# Patient Record
Sex: Female | Born: 1955
Health system: Southern US, Community
[De-identification: ages and names within clinical notes are randomized; demographics above are authoritative.]

## PROBLEM LIST (undated history)

## (undated) DIAGNOSIS — R9089 Other abnormal findings on diagnostic imaging of central nervous system: Secondary | ICD-10-CM

## (undated) DIAGNOSIS — Q2112 Patent foramen ovale: Secondary | ICD-10-CM

## (undated) DIAGNOSIS — G43909 Migraine, unspecified, not intractable, without status migrainosus: Secondary | ICD-10-CM

## (undated) DIAGNOSIS — D649 Anemia, unspecified: Secondary | ICD-10-CM

## (undated) DIAGNOSIS — Q211 Atrial septal defect: Secondary | ICD-10-CM

## (undated) HISTORY — DX: Migraine, unspecified, not intractable, without status migrainosus: G43.909

## (undated) HISTORY — DX: Atrial septal defect: Q21.1

## (undated) HISTORY — DX: Patent foramen ovale: Q21.12

## (undated) HISTORY — DX: Other abnormal findings on diagnostic imaging of central nervous system: R90.89

## (undated) HISTORY — PX: CERVICAL FUSION: SHX112

---

## 2000-02-15 ENCOUNTER — Other Ambulatory Visit: Admission: RE | Admit: 2000-02-15 | Discharge: 2000-02-15 | Payer: Self-pay | Admitting: Family Medicine

## 2006-06-07 ENCOUNTER — Other Ambulatory Visit: Admission: RE | Admit: 2006-06-07 | Discharge: 2006-06-07 | Payer: Self-pay | Admitting: Family Medicine

## 2008-12-18 ENCOUNTER — Other Ambulatory Visit: Admission: RE | Admit: 2008-12-18 | Discharge: 2008-12-18 | Payer: Self-pay | Admitting: Family Medicine

## 2011-10-06 ENCOUNTER — Other Ambulatory Visit: Payer: Self-pay | Admitting: Obstetrics & Gynecology

## 2011-10-06 ENCOUNTER — Ambulatory Visit (HOSPITAL_COMMUNITY)
Admission: RE | Admit: 2011-10-06 | Discharge: 2011-10-06 | Disposition: A | Discharge status: Home / Self Care | Payer: BC Managed Care – PPO | Source: Ambulatory Visit | Attending: Obstetrics & Gynecology | Admitting: Obstetrics & Gynecology

## 2011-10-06 DIAGNOSIS — N920 Excessive and frequent menstruation with regular cycle: Secondary | ICD-10-CM | POA: Insufficient documentation

## 2011-10-06 DIAGNOSIS — N83209 Unspecified ovarian cyst, unspecified side: Secondary | ICD-10-CM | POA: Insufficient documentation

## 2011-10-06 DIAGNOSIS — D251 Intramural leiomyoma of uterus: Secondary | ICD-10-CM | POA: Insufficient documentation

## 2011-10-11 ENCOUNTER — Encounter (HOSPITAL_COMMUNITY): Payer: Self-pay | Admitting: *Deleted

## 2011-10-13 ENCOUNTER — Encounter (HOSPITAL_COMMUNITY): Payer: Self-pay | Admitting: Pharmacist

## 2011-10-18 ENCOUNTER — Encounter (HOSPITAL_COMMUNITY): Payer: Self-pay | Admitting: Anesthesiology

## 2011-10-18 ENCOUNTER — Encounter (HOSPITAL_COMMUNITY): Payer: Self-pay | Admitting: *Deleted

## 2011-10-18 ENCOUNTER — Ambulatory Visit (HOSPITAL_COMMUNITY)
Admission: RE | Admit: 2011-10-18 | Discharge: 2011-10-18 | Disposition: A | Discharge status: Home / Self Care | Payer: BC Managed Care – PPO | Source: Ambulatory Visit | Attending: Obstetrics & Gynecology | Admitting: Obstetrics & Gynecology

## 2011-10-18 ENCOUNTER — Ambulatory Visit (HOSPITAL_COMMUNITY): Payer: BC Managed Care – PPO | Admitting: Anesthesiology

## 2011-10-18 ENCOUNTER — Encounter (HOSPITAL_COMMUNITY)
Admission: RE | Disposition: A | Discharge status: Home / Self Care | Payer: Self-pay | Source: Ambulatory Visit | Attending: Obstetrics & Gynecology

## 2011-10-18 DIAGNOSIS — D500 Iron deficiency anemia secondary to blood loss (chronic): Secondary | ICD-10-CM | POA: Insufficient documentation

## 2011-10-18 DIAGNOSIS — D649 Anemia, unspecified: Secondary | ICD-10-CM | POA: Diagnosis present

## 2011-10-18 DIAGNOSIS — N950 Postmenopausal bleeding: Secondary | ICD-10-CM | POA: Diagnosis present

## 2011-10-18 HISTORY — DX: Anemia, unspecified: D64.9

## 2011-10-18 HISTORY — PX: HYSTEROSCOPY WITH D & C: SHX1775

## 2011-10-18 LAB — CBC
HCT: 36.3 % (ref 36.0–46.0)
Hemoglobin: 11.8 g/dL — ABNORMAL LOW (ref 12.0–15.0)
MCH: 32.2 pg (ref 26.0–34.0)
MCHC: 32.5 g/dL (ref 30.0–36.0)
MCV: 98.9 fL (ref 78.0–100.0)
Platelets: 253 10*3/uL (ref 150–400)
RBC: 3.67 MIL/uL — ABNORMAL LOW (ref 3.87–5.11)
RDW: 13.9 % (ref 11.5–15.5)
WBC: 4.3 10*3/uL (ref 4.0–10.5)

## 2011-10-18 SURGERY — DILATATION AND CURETTAGE /HYSTEROSCOPY
Anesthesia: General | Site: Vagina | Wound class: Clean Contaminated

## 2011-10-18 MED ORDER — MIDAZOLAM HCL 5 MG/5ML IJ SOLN
INTRAMUSCULAR | Status: DC | PRN
  Administered 2011-10-18: 2 mg via INTRAVENOUS

## 2011-10-18 MED ORDER — LACTATED RINGERS IV SOLN
INTRAVENOUS | Status: DC
  Administered 2011-10-18 (×2): via INTRAVENOUS

## 2011-10-18 MED ORDER — CEFAZOLIN SODIUM 1-5 GM-% IV SOLN
1.0000 g | INTRAVENOUS | Status: AC
  Administered 2011-10-18: 1 g via INTRAVENOUS

## 2011-10-18 MED ORDER — GLYCOPYRROLATE 0.2 MG/ML IJ SOLN
INTRAMUSCULAR | Status: AC
  Filled 2011-10-18: qty 1

## 2011-10-18 MED ORDER — ONDANSETRON HCL 4 MG/2ML IJ SOLN
INTRAMUSCULAR | Status: AC
  Filled 2011-10-18: qty 2

## 2011-10-18 MED ORDER — PROPOFOL 10 MG/ML IV EMUL
INTRAVENOUS | Status: DC | PRN
  Administered 2011-10-18: 180 mg via INTRAVENOUS

## 2011-10-18 MED ORDER — PROPOFOL 10 MG/ML IV EMUL
INTRAVENOUS | Status: AC
  Filled 2011-10-18: qty 20

## 2011-10-18 MED ORDER — ONDANSETRON HCL 4 MG/2ML IJ SOLN
INTRAMUSCULAR | Status: DC | PRN
  Administered 2011-10-18: 4 mg via INTRAVENOUS

## 2011-10-18 MED ORDER — KETOROLAC TROMETHAMINE 30 MG/ML IJ SOLN
INTRAMUSCULAR | Status: DC | PRN
  Administered 2011-10-18: 30 mg via INTRAVENOUS

## 2011-10-18 MED ORDER — MIDAZOLAM HCL 2 MG/2ML IJ SOLN
INTRAMUSCULAR | Status: AC
  Filled 2011-10-18: qty 2

## 2011-10-18 MED ORDER — GLYCOPYRROLATE 0.2 MG/ML IJ SOLN
INTRAMUSCULAR | Status: DC | PRN
  Administered 2011-10-18: 0.2 mg via INTRAVENOUS

## 2011-10-18 MED ORDER — LIDOCAINE HCL (CARDIAC) 20 MG/ML IV SOLN
INTRAVENOUS | Status: DC | PRN
  Administered 2011-10-18: 60 mg via INTRAVENOUS

## 2011-10-18 MED ORDER — MUPIROCIN 2 % EX OINT
TOPICAL_OINTMENT | CUTANEOUS | Status: AC
  Filled 2011-10-18: qty 22

## 2011-10-18 MED ORDER — LIDOCAINE-EPINEPHRINE 1 %-1:100000 IJ SOLN
INTRAMUSCULAR | Status: DC | PRN
  Administered 2011-10-18: 10 mL

## 2011-10-18 MED ORDER — FENTANYL CITRATE 0.05 MG/ML IJ SOLN
INTRAMUSCULAR | Status: AC
  Filled 2011-10-18: qty 2

## 2011-10-18 MED ORDER — LIDOCAINE HCL (CARDIAC) 20 MG/ML IV SOLN
INTRAVENOUS | Status: AC
  Filled 2011-10-18: qty 5

## 2011-10-18 MED ORDER — CEFAZOLIN SODIUM 1-5 GM-% IV SOLN
INTRAVENOUS | Status: AC
  Filled 2011-10-18: qty 50

## 2011-10-18 MED ORDER — KETOROLAC TROMETHAMINE 30 MG/ML IJ SOLN
INTRAMUSCULAR | Status: AC
  Filled 2011-10-18: qty 1

## 2011-10-18 MED ORDER — FENTANYL CITRATE 0.05 MG/ML IJ SOLN
INTRAMUSCULAR | Status: DC | PRN
  Administered 2011-10-18: 100 ug via INTRAVENOUS

## 2011-10-18 MED ORDER — DEXAMETHASONE SODIUM PHOSPHATE 10 MG/ML IJ SOLN
INTRAMUSCULAR | Status: DC | PRN
  Administered 2011-10-18: 10 mg via INTRAVENOUS

## 2011-10-18 MED ORDER — DEXAMETHASONE SODIUM PHOSPHATE 10 MG/ML IJ SOLN
INTRAMUSCULAR | Status: AC
  Filled 2011-10-18: qty 1

## 2011-10-18 MED ORDER — FENTANYL CITRATE 0.05 MG/ML IJ SOLN: 25.0000 ug | INTRAMUSCULAR | Status: DC | PRN

## 2011-10-18 MED ORDER — GLYCINE 1.5 % IR SOLN
Status: DC | PRN
  Administered 2011-10-18: 3000 mL

## 2011-10-18 MED ORDER — HYDROCODONE-ACETAMINOPHEN 5-500 MG PO TABS: 2.0000 | ORAL_TABLET | Freq: Four times a day (QID) | ORAL | Status: DC | PRN

## 2011-10-18 SURGICAL SUPPLY — 16 items
CANISTER SUCTION 2500CC (MISCELLANEOUS) ×2 IMPLANT
CATH ROBINSON RED A/P 16FR (CATHETERS) ×2 IMPLANT
CLOTH BEACON ORANGE TIMEOUT ST (SAFETY) ×2 IMPLANT
CONTAINER PREFILL 10% NBF 60ML (FORM) ×2 IMPLANT
DILATOR CANAL MILEX (MISCELLANEOUS) IMPLANT
ELECT REM PT RETURN 9FT ADLT (ELECTROSURGICAL)
ELECTRODE REM PT RTRN 9FT ADLT (ELECTROSURGICAL) IMPLANT
GLOVE BIOGEL PI IND STRL 7.0 (GLOVE) ×1 IMPLANT
GLOVE BIOGEL PI INDICATOR 7.0 (GLOVE) ×1
GLOVE ECLIPSE 6.5 STRL STRAW (GLOVE) ×4 IMPLANT
GOWN PREVENTION PLUS LG XLONG (DISPOSABLE) ×2 IMPLANT
GOWN STRL REIN XL XLG (GOWN DISPOSABLE) ×2 IMPLANT
LOOP ANGLED CUTTING 22FR (CUTTING LOOP) IMPLANT
PACK HYSTEROSCOPY LF (CUSTOM PROCEDURE TRAY) ×2 IMPLANT
TOWEL OR 17X24 6PK STRL BLUE (TOWEL DISPOSABLE) ×4 IMPLANT
WATER STERILE IRR 1000ML POUR (IV SOLUTION) ×2 IMPLANT

## 2011-10-18 NOTE — Addendum Note (Signed)
Addendum  created 10/18/11 1437 by Algis Greenhouse, CRNA   Modules edited:Charges VN

## 2011-10-18 NOTE — Discharge Instructions (Signed)
Post-surgical Instructions, Outpatient Surgery  You may expect to feel dizzy, weak, and drowsy for as long as 24 hours after receiving the medicine that made you sleep (anesthetic). For the first 24 hours after your surgery:    Do not drive a car, ride a bicycle, participate in physical activities, or take public transportation until you are done taking narcotic pain medicines or as directed by Dr. Hyacinth Meeker.   Do not drink alcohol or take tranquilizers.   Do not take medicine that has not been prescribed by your physicians.   Do not sign important papers or make important decisions while on narcotic pain medicines.   Have a responsible person with you.   PAIN MANAGEMENT  Motrin 800mg .  (This is the same as 4-200mg  over the counter tablets of Motrin or ibuprofen.)  You may take this every eight hours or as needed for cramping.    Vicodin 5/500mg .  For more severe pain, take one or two tablets every four to six hours as needed for pain control.  (Remember that narcotic pain medications increase your risk of constipation.  If this becomes a problem, you may take an over the counter stool softener like Colace 100mg  up to four times a day.)  DO'S AND DON'T'S  Do not take a tub bath for one week.  You may shower on the first day after your surgery  Do not do any heavy lifting for one to two weeks.  This increases the chance of bleeding.  Do move around as you feel able.  Stairs are fine.  You may begin to exercise again as you feel able.  Do not lift any weights for two weeks.  Do not put anything in the vagina for two weeks--no tampons, intercourse, or douching.    REGULAR MEDIATIONS/VITAMINS:  You may restart all of your regular medications as prescribed.  You may restart all of your vitamins as you normally take them.   Stop your Aygestin.  You will start a cycle over the next few days.  This may be heavy.  Do not worry.  Call the office if you have concerns.  [phone].  If it is after  hours, the message will tell you how to reach the on-call physician.   PLEASE CALL OR SEEK MEDICAL CARE IF:  You have persistent nausea and vomiting.   You have trouble eating or drinking.   You have an oral temperature above 100.5.   You have constipation that is not helped by adjusting diet or increasing fluid intake. Pain medicines are a common cause of constipation.   You have heavy vaginal bleeding  You have redness or drainage from your incision(s) or there is increasing pain or tenderness near or in the surgical site.

## 2011-10-18 NOTE — Transfer of Care (Signed)
Immediate Anesthesia Transfer of Care Note  Patient: Angela Snyder  Procedure(s) Performed: Procedure(s) (LRB): DILATATION AND CURETTAGE /HYSTEROSCOPY (N/A)  Patient Location: PACU  Anesthesia Type: General  Level of Consciousness: awake and sedated  Airway & Oxygen Therapy: Patient Spontanous Breathing and Patient connected to nasal cannula oxygen  Post-op Assessment: Report given to PACU RN  Post vital signs: Reviewed and stable  Complications: No apparent anesthesia complications

## 2011-10-18 NOTE — Anesthesia Preprocedure Evaluation (Addendum)

## 2011-10-18 NOTE — Anesthesia Postprocedure Evaluation (Signed)
  Anesthesia Post-op Note  Patient: Angela Snyder  Procedure(s) Performed: Procedure(s) (LRB): DILATATION AND CURETTAGE /HYSTEROSCOPY (N/A)  Patient is awake and responsive. Pain and nausea are reasonably well controlled. Vital signs are stable and clinically acceptable. Oxygen saturation is clinically acceptable. There are no apparent anesthetic complications at this time. Patient is ready for discharge.

## 2011-10-18 NOTE — Op Note (Addendum)
10/18/2011  11:00 AM  PATIENT:  Angela Snyder  56 y.o. female G4P3 MWF with postmenopausal bleeding for about 40 days.  Anemia resulting from this bleeding.  Endometrial biopsy showing proliferative endometrium.  PRE-OPERATIVE DIAGNOSIS:  PMB  POST-OPERATIVE DIAGNOSIS:  PMB  PROCEDURE:  Procedure(s): DILATATION AND CURETTAGE /HYSTEROSCOPY  SURGEON:  Jahron Hunsinger,M SUZANNE  ASSISTANTS: OR staff   ANESTHESIA:   LMA, Dr. Cristela Blue oversaw the case  ESTIMATED BLOOD LOSS: * No blood loss amount entered *  BLOOD ADMINISTERED:none   FLUIDS: 1200ccLR  UOP: 200 cc drained with I&O cath at beginning of procedure  SPECIMEN:  Large amount of endometrial curettings  DISPOSITION OF SPECIMEN:  PATHOLOGY  FINDINGS: thick, fluffy endometrium.  No evidence of polyps  DESCRIPTION OF OPERATION: Patient was taken to the operating room. She is placed in the supine position. Anesthesia was administered by the anesthesia staff without difficulty. The legs were lifted to the low lithotomy position and placed in [place] stirrups. SCDs were on her lower extremities and functioning properly. Legs were lifted to the high lithotomy position and the perineum inner thighs and vagina were prepped and draped in a normal standard fashion with Betadine prep. A timeout was performed. A red rubber Foley catheter was used to drain the bladder of all urine. A bivalve speculum was placed in the vagina. The anterior lip of the cervix was grasped with a single-tooth tenaculum. A paracervical block of 1% Xylocaine mixed one-to-one with epinephrine (1:100,000 units) was used. 10 cc total was placed. The uterus then sounded to 9-1/2 cm. The cervix is dilated with Pratt dilators up to #21. A 3.9 mm diagnostic hysteroscope was obtained. This was passed to the cervical canal into the endometrial cavity. There was fluffy thick vascular-looking tissue present. Using a #1 toothed curette, the endometrial cavity was curetted until rough  gritty texture is noted in all quadrants.  A large amount of specimen was obtained.  The hysteroscope was used to revisualize endometrial cavity. There was a significant improvement amount of tissue that was present. At this point the procedure was ended. The hysteroscope was removed. The tenaculum was from the anterior lip of the cervix and there was no bleeding noted. The bivalve speculum was removed from the vagina. Sponge, laps, instruments, and needle counts were correct x2. Patient was given 30 mg IV of Toradol.  1.5% Glycine was used as the hysteroscopic fluid.  Deficit was 115cc but this includes fluid that was on the floor.  Betadine prep was washed of her skin and her legs were taken out of the [place] stirrups and placed back in the supine position. She was awakened from anesthesia and taken to the room in stable condition.  COUNTS:  YES  PLAN OF CARE: Transfer to PACU

## 2011-10-18 NOTE — H&P (Signed)
Angela Snyder is an 56 y.o. female G4P3 MWf here with postmenopausal bleeding.  Hemoglobin was 10.6 due to her bleeding for about 40 days.  She was started on Aygestin 10mg  BID and and endometrial biopsy was performed.  This was negative for abnormal cells.  TVUS showed endometrial thickness of 2cm.  Hysteroscopy and D&C recommended to thin endometrium and treat bleeding.  Pertinent Gynecological History: Menses: post-menopausal Bleeding: post menopausal bleeding Contraception: none DES exposure: denies Blood transfusions: none Sexually transmitted diseases: no past history Previous GYN Procedures: none  Last mammogram: normal Date: 2013 Last pap: normal Date: 2013 OB History: G4, P3   Menstrual History: Menarche age: 81 No LMP recorded. Patient is postmenopausal.    Past Medical History  Diagnosis Date  . Anemia     Past Surgical History  Procedure Date  . Cesarean section     x 3  . Cervical fusion     C5/6    History reviewed. No pertinent family history.  Social History:  does not have a smoking history on file. She has never used smokeless tobacco. She reports that she drinks about 4.2 ounces of alcohol per week. She reports that she does not use illicit drugs.  Allergies:  Allergies  Allergen Reactions  . Codeine Hives    Prescriptions prior to admission  Medication Sig Dispense Refill  . calcium carbonate (OS-CAL) 600 MG TABS Take 600 mg by mouth daily.      Marland Kitchen estradiol (VIVELLE-DOT) 0.1 MG/24HR Place 1 patch onto the skin 2 (two) times a week.      . ferrous sulfate 325 (65 FE) MG tablet Take 325 mg by mouth daily with breakfast.      . norethindrone (AYGESTIN) 5 MG tablet Take 10 mg by mouth 2 (two) times daily.      . Omega-3 Fatty Acids (FISH OIL) 1200 MG CAPS Take 2 capsules by mouth daily.        Review of Systems  Constitutional: Negative for fever and chills.  Respiratory: Negative for cough.   Cardiovascular: Negative for chest pain and  palpitations.  Gastrointestinal: Negative for heartburn.  Genitourinary: Negative for dysuria.  Musculoskeletal: Negative for myalgias.  Skin: Negative for rash.  Neurological: Negative for headaches.  Psychiatric/Behavioral: Negative for depression.    Blood pressure 106/57, pulse 62, temperature 98.1 F (36.7 C), temperature source Oral, resp. rate 18, height 5\' 4"  (1.626 m), weight 49.896 kg (110 lb), SpO2 100.00%. Physical Exam  Constitutional: She is oriented to person, place, and time. She appears well-developed and well-nourished.  HENT:  Head: Normocephalic and atraumatic.  Neck: Normal range of motion. Neck supple.  Cardiovascular: Normal rate and regular rhythm.   Respiratory: Effort normal and breath sounds normal.  GI: Soft. Bowel sounds are normal.  Neurological: She is alert and oriented to person, place, and time.  Skin: Skin is warm and dry.  Psychiatric: She has a normal mood and affect.    Results for orders placed during the hospital encounter of 10/18/11 (from the past 24 hour(s))  CBC     Status: Abnormal   Collection Time   10/18/11  9:03 AM      Component Value Range   WBC 4.3  4.0 - 10.5 (K/uL)   RBC 3.67 (*) 3.87 - 5.11 (MIL/uL)   Hemoglobin 11.8 (*) 12.0 - 15.0 (g/dL)   HCT 11.9  14.7 - 82.9 (%)   MCV 98.9  78.0 - 100.0 (fL)   MCH 32.2  26.0 -  34.0 (pg)   MCHC 32.5  30.0 - 36.0 (g/dL)   RDW 16.1  09.6 - 04.5 (%)   Platelets 253  150 - 400 (K/uL)    No results found.  Assessment/Plan: 63 year G4P3 MWF with postmenopausal bleeding here for D&C and hysteroscopy.  Risks and benefits discussed.  Patient here and ready to proceed.  Ramisa Duman,M SUZANNE 10/18/2011, 10:03 AM

## 2011-10-19 ENCOUNTER — Encounter (HOSPITAL_COMMUNITY): Payer: Self-pay | Admitting: Obstetrics & Gynecology

## 2012-02-28 ENCOUNTER — Encounter: Payer: Self-pay | Admitting: Internal Medicine

## 2012-04-03 ENCOUNTER — Ambulatory Visit (AMBULATORY_SURGERY_CENTER): Payer: BC Managed Care – PPO | Admitting: *Deleted

## 2012-04-03 VITALS — Ht 64.0 in | Wt 110.0 lb

## 2012-04-03 DIAGNOSIS — Z1211 Encounter for screening for malignant neoplasm of colon: Secondary | ICD-10-CM

## 2012-04-03 MED ORDER — SUPREP BOWEL PREP KIT 17.5-3.13-1.6 GM/177ML PO SOLN: ORAL | Status: DC

## 2012-04-04 ENCOUNTER — Telehealth: Payer: Self-pay | Admitting: *Deleted

## 2012-04-04 NOTE — Telephone Encounter (Signed)
Left message on phone to stop Iron 5-7 days before procedure.

## 2012-04-06 ENCOUNTER — Other Ambulatory Visit: Payer: Self-pay | Admitting: Otolaryngology

## 2012-04-06 DIAGNOSIS — I69993 Ataxia following unspecified cerebrovascular disease: Secondary | ICD-10-CM

## 2012-04-11 ENCOUNTER — Ambulatory Visit
Admission: RE | Admit: 2012-04-11 | Discharge: 2012-04-11 | Disposition: A | Discharge status: Home / Self Care | Payer: BC Managed Care – PPO | Source: Ambulatory Visit | Attending: Otolaryngology | Admitting: Otolaryngology

## 2012-04-11 DIAGNOSIS — I69993 Ataxia following unspecified cerebrovascular disease: Secondary | ICD-10-CM

## 2012-04-11 MED ORDER — GADOBENATE DIMEGLUMINE 529 MG/ML IV SOLN
10.0000 mL | Freq: Once | INTRAVENOUS | Status: AC | PRN
  Administered 2012-04-11: 10 mL via INTRAVENOUS

## 2012-04-17 ENCOUNTER — Encounter: Payer: Self-pay | Admitting: Internal Medicine

## 2012-04-17 ENCOUNTER — Ambulatory Visit (AMBULATORY_SURGERY_CENTER): Payer: BC Managed Care – PPO | Admitting: Internal Medicine

## 2012-04-17 VITALS — BP 154/79 | HR 67 | Temp 96.8°F | Resp 18 | Ht 64.0 in | Wt 110.0 lb

## 2012-04-17 DIAGNOSIS — Z1211 Encounter for screening for malignant neoplasm of colon: Secondary | ICD-10-CM

## 2012-04-17 DIAGNOSIS — D126 Benign neoplasm of colon, unspecified: Secondary | ICD-10-CM

## 2012-04-17 MED ORDER — SODIUM CHLORIDE 0.9 % IV SOLN: 500.0000 mL | INTRAVENOUS | Status: DC

## 2012-04-17 NOTE — Op Note (Signed)
[facility] [address] [place], [address]   COLONOSCOPY PROCEDURE REPORT  PATIENT: Angela, Snyder  MR#: [id number] BIRTHDATE: June 18, 1956 , 55  yrs. old GENDER: Female ENDOSCOPIST: Beverley Fiedler, MD REFERRED AO:[id number], Consuella Lose PROCEDURE DATE:  04/17/2012 PROCEDURE:   Colonoscopy with snare polypectomy and Colonoscopy with cold biopsy polypectomy ASA CLASS:   Class I INDICATIONS:average risk screening and first colonoscopy. MEDICATIONS: MAC sedation, administered by CRNA and Propofol (Diprivan) 220 mg IV  DESCRIPTION OF PROCEDURE:   After the risks benefits and alternatives of the procedure were thoroughly explained, informed consent was obtained.  A digital rectal exam revealed no rectal mass.   The LB [id number]-[id number]  endoscope was introduced through the anus and advanced to the cecum, which was identified by both the appendix and ileocecal valve. No adverse events experienced. The quality of the prep was Suprep good  The instrument was then slowly withdrawn as the colon was fully examined.   COLON FINDINGS: A sessile polyp measuring 5 mm in size was found in the ascending colon.  A polypectomy was performed with a cold snare.  The resection was complete and the polyp tissue was completely retrieved.   A sessile polyp measuring 3 mm in size was found in the rectum.  A polypectomy was performed with cold forceps.  The resection was complete and the polyp tissue was completely retrieved.   The colon mucosa was otherwise normal. Retroflexed views revealed no abnormalities. The time to cecum=7 minutes 28 seconds.  Withdrawal time=13 minutes 18 seconds.  The scope was withdrawn and the procedure completed. COMPLICATIONS: There were no complications.  ENDOSCOPIC IMPRESSION: 1.   Sessile polyp measuring 5 mm in size was found in the ascending colon; polypectomy was performed with a cold snare 2.   Sessile polyp measuring 3 mm in size was found in the  rectum; polypectomy was performed with cold forceps 3.   The colon mucosa was otherwise normal  RECOMMENDATIONS: 1.  If the polyps removed today are proven to be adenomatous (pre-cancerous) polyps, you will need a repeat colonoscopy in 5 years.  Otherwise you should continue to follow colorectal cancer screening guidelines for "routine risk" patients with colonoscopy in 10 years.  You will receive a letter within 1-2 weeks with the results of your biopsy as well as final recommendations.  Please call my office if you have not received a letter after 3 weeks. 2.  await pathology results   eSigned:  Beverley Fiedler, MD 04/17/2012 12:18 PM   cc: Maurice Small, MD and The Patient   PATIENT NAME:  Angela, Snyder MR#: [id number]

## 2012-04-17 NOTE — Progress Notes (Signed)
Patient did not experience any of the following events: a burn prior to discharge; a fall within the facility; wrong site/side/patient/procedure/implant event; or a hospital transfer or hospital admission upon discharge from the facility. (G8907) Patient did not have preoperative order for IV antibiotic SSI prophylaxis. (G8918)  

## 2012-04-17 NOTE — Patient Instructions (Addendum)
Findings: Polyps  YOU HAD AN ENDOSCOPIC PROCEDURE TODAY AT THE [facility]: Refer to the procedure report that was given to you for any specific questions about what was found during the examination.  If the procedure report does not answer your questions, please call your gastroenterologist to clarify.  If you requested that your care partner not be given the details of your procedure findings, then the procedure report has been included in a sealed envelope for you to review at your convenience later.  YOU SHOULD EXPECT: Some feelings of bloating in the abdomen. Passage of more gas than usual.  Walking can help get rid of the air that was put into your GI tract during the procedure and reduce the bloating. If you had a lower endoscopy (such as a colonoscopy or flexible sigmoidoscopy) you may notice spotting of blood in your stool or on the toilet paper. If you underwent a bowel prep for your procedure, then you may not have a normal bowel movement for a few days.  DIET: Your first meal following the procedure should be a light meal and then it is ok to progress to your normal diet.  A half-sandwich or bowl of soup is an example of a good first meal.  Heavy or fried foods are harder to digest and may make you feel nauseous or bloated.  Likewise meals heavy in dairy and vegetables can cause extra gas to form and this can also increase the bloating.  Drink plenty of fluids but you should avoid alcoholic beverages for 24 hours.  ACTIVITY: Your care partner should take you home directly after the procedure.  You should plan to take it easy, moving slowly for the rest of the day.  You can resume normal activity the day after the procedure however you should NOT DRIVE or use heavy machinery for 24 hours (because of the sedation medicines used during the test).    SYMPTOMS TO REPORT IMMEDIATELY: A gastroenterologist can be reached at any hour.  During normal business hours, 8:30 AM to 5:00 PM  Monday through Friday, call [phone].  After hours and on weekends, please call the GI answering service at [phone] who will take a message and have the physician on call contact you.   Following lower endoscopy (colonoscopy or flexible sigmoidoscopy):  Excessive amounts of blood in the stool  Significant tenderness or worsening of abdominal pains  Swelling of the abdomen that is new, acute  Fever of 100F or higher  Following upper endoscopy (EGD)  Vomiting of blood or coffee ground material  New chest pain or pain under the shoulder blades  Painful or persistently difficult swallowing  New shortness of breath  Fever of 100F or higher  Black, tarry-looking stools  FOLLOW UP: If any biopsies were taken you will be contacted by phone or by letter within the next 1-3 weeks.  Call your gastroenterologist if you have not heard about the biopsies in 3 weeks.  Our staff will call the home number listed on your records the next business day following your procedure to check on you and address any questions or concerns that you may have at that time regarding the information given to you following your procedure. This is a courtesy call and so if there is no answer at the home number and we have not heard from you through the emergency physician on call, we will assume that you have returned to your regular daily activities without incident.  SIGNATURES/CONFIDENTIALITY: You  and/or your care partner have signed paperwork which will be entered into your electronic medical record.  These signatures attest to the fact that that the information above on your After Visit Summary has been reviewed and is understood.  Full responsibility of the confidentiality of this discharge information lies with you and/or your care-partner.   Please follow all discharge instructions given to you by the recovery room nurse. If you have any questions or problems after discharge please call one of the numbers  listed above. You will receive a phone call in the am to see how you are doing and answer any questions you may have. Thank you for choosing [facility] for your health care needs.

## 2012-04-17 NOTE — Progress Notes (Signed)
Pressure applied to abdomen to reach cecum.  

## 2012-04-18 ENCOUNTER — Telehealth: Payer: Self-pay

## 2012-04-18 NOTE — Telephone Encounter (Signed)
Left a message on the pt's answering machine [phone] for the pt to call if any questions or concerns. Maw

## 2012-04-23 DIAGNOSIS — K621 Rectal polyp: Secondary | ICD-10-CM

## 2012-04-23 DIAGNOSIS — K620 Anal polyp: Secondary | ICD-10-CM

## 2012-04-23 DIAGNOSIS — D126 Benign neoplasm of colon, unspecified: Secondary | ICD-10-CM

## 2012-04-24 ENCOUNTER — Encounter: Payer: Self-pay | Admitting: Internal Medicine

## 2012-08-28 DIAGNOSIS — G44009 Cluster headache syndrome, unspecified, not intractable: Secondary | ICD-10-CM | POA: Insufficient documentation

## 2012-08-28 DIAGNOSIS — F4322 Adjustment disorder with anxiety: Secondary | ICD-10-CM | POA: Insufficient documentation

## 2012-08-28 DIAGNOSIS — M5030 Other cervical disc degeneration, unspecified cervical region: Secondary | ICD-10-CM | POA: Insufficient documentation

## 2012-08-28 DIAGNOSIS — N951 Menopausal and female climacteric states: Secondary | ICD-10-CM | POA: Insufficient documentation

## 2012-08-28 DIAGNOSIS — R42 Dizziness and giddiness: Secondary | ICD-10-CM | POA: Insufficient documentation

## 2012-09-05 DIAGNOSIS — Q211 Atrial septal defect: Secondary | ICD-10-CM | POA: Insufficient documentation

## 2012-09-05 DIAGNOSIS — Q2112 Patent foramen ovale: Secondary | ICD-10-CM | POA: Insufficient documentation

## 2013-02-22 ENCOUNTER — Telehealth: Payer: Self-pay | Admitting: Neurology

## 2013-02-22 NOTE — Telephone Encounter (Signed)
Schedule with lynn or me when opening

## 2013-02-22 NOTE — Telephone Encounter (Signed)
Patient having dizzy spells and wants to be seen ASAP. Can patient be scheduled with you or Larita Fife? Please advise.

## 2013-06-11 IMAGING — US US TRANSVAGINAL NON-OB
1 series · 13 of 25 positions shown · non-contrast
Comparison: None.

CLINICAL DATA: Excessive or frequent menstruation. Perimenoausal.
LMP 09/08/2011.

TRANSABDOMINAL AND TRANSVAGINAL ULTRASOUND OF PELVIS
TECHNIQUE: Both transabdominal and transvaginal ultrasound
examinations of the pelvis were performed. Transabdominal technique
was performed for global imaging of the pelvis including uterus,
ovaries, adnexal regions, and pelvic cul-de-sac.

[Series 1: us pelvis complete · 13 of 61 slices shown]
[im 1/61]
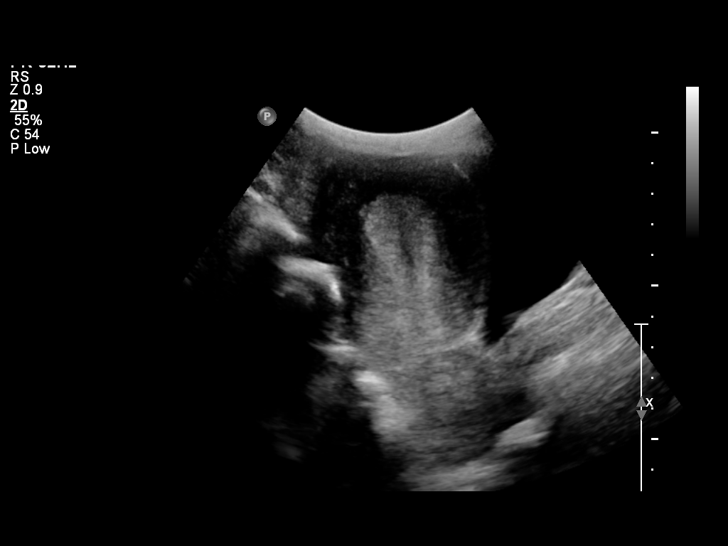
[im 6/61]
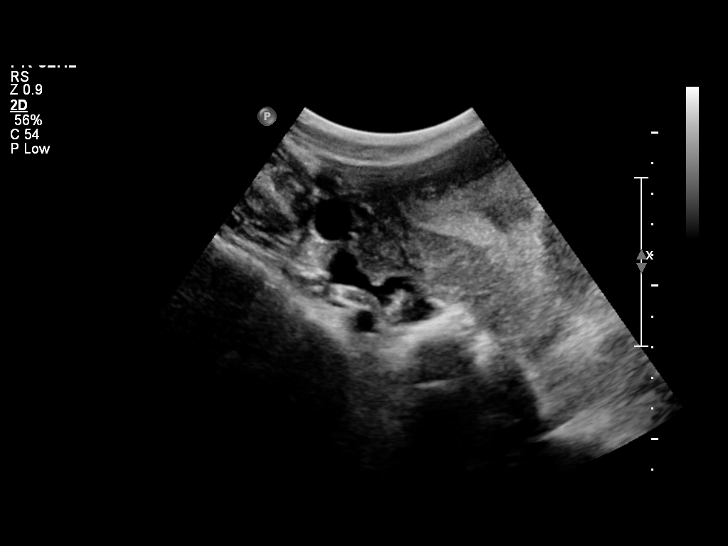
[im 11/61]
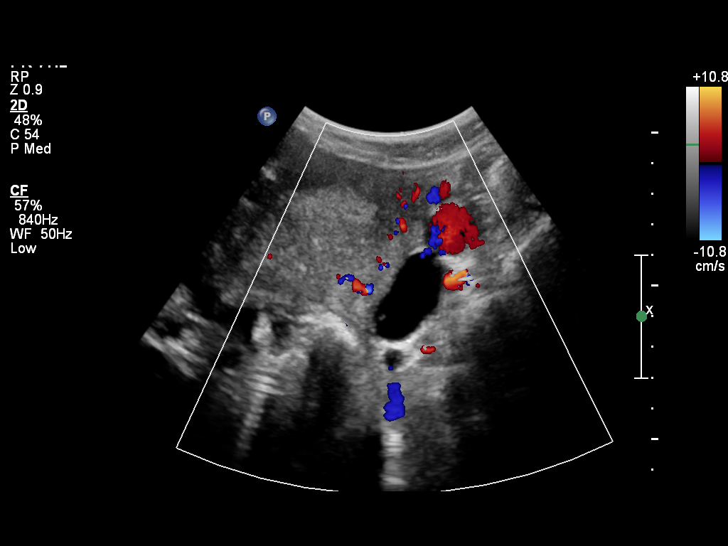
[im 16/61]
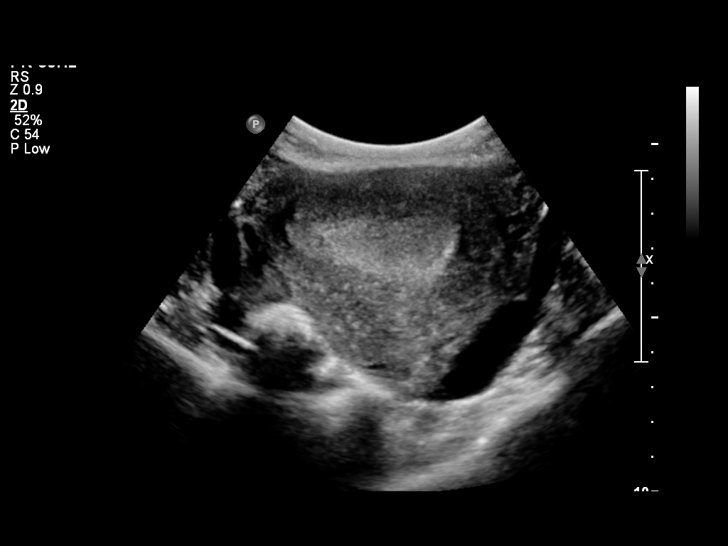
[im 21/61]
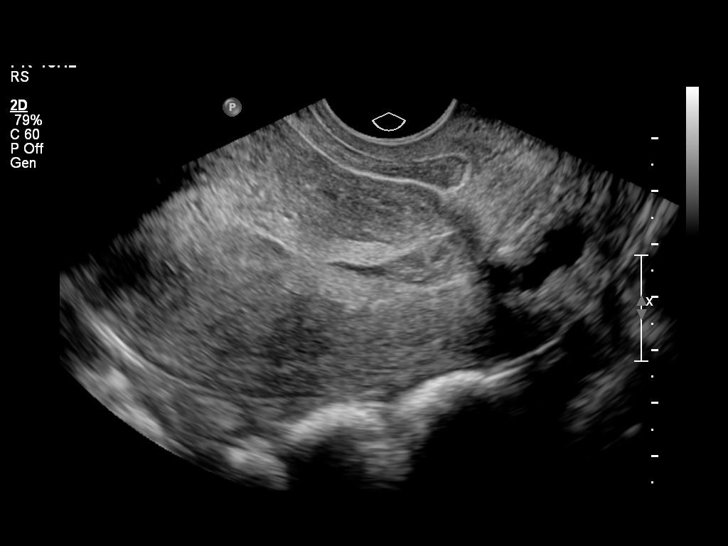
[im 26/61]
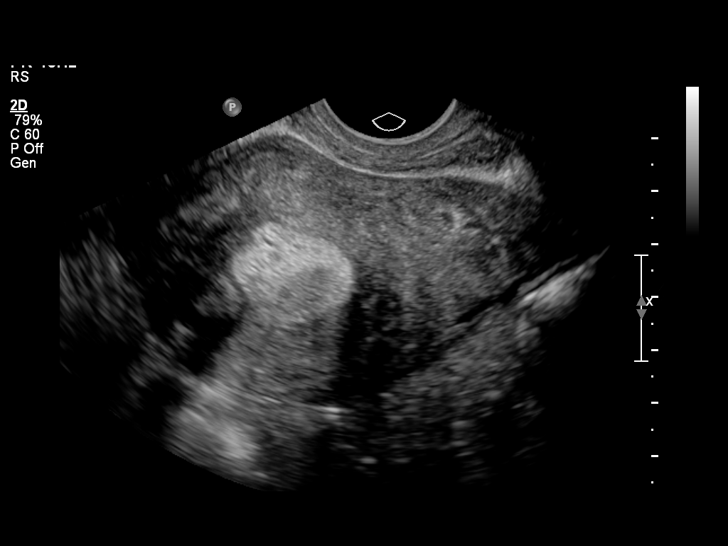
[im 31/61]
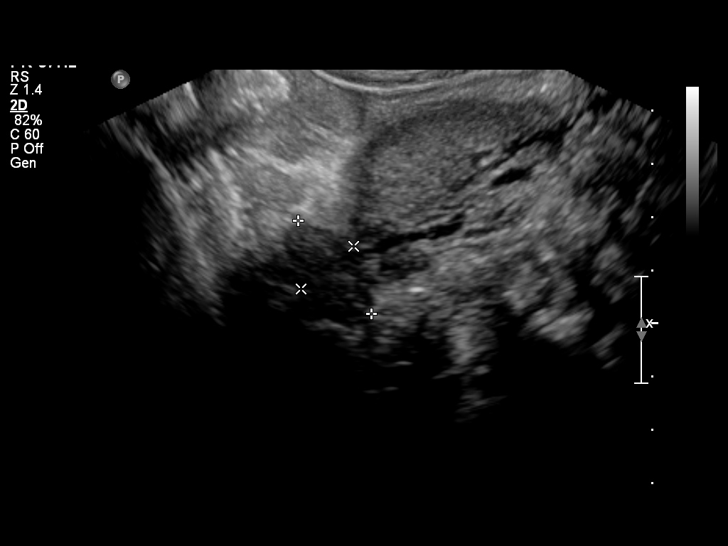
[im 36/61]
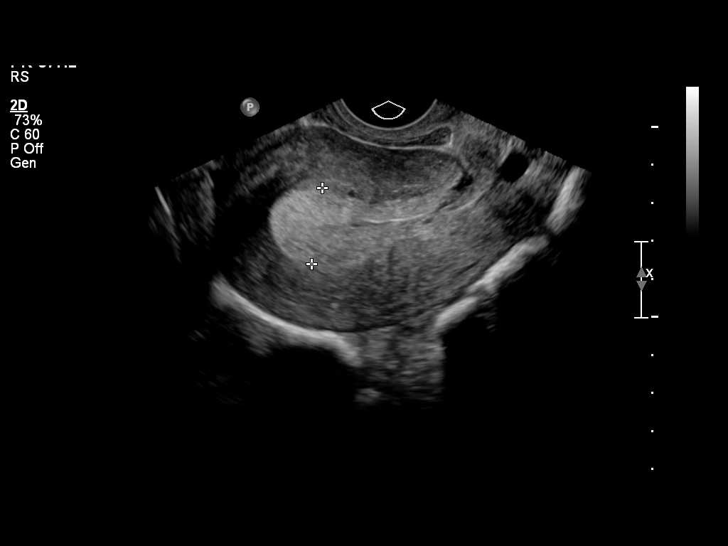
[im 41/61]
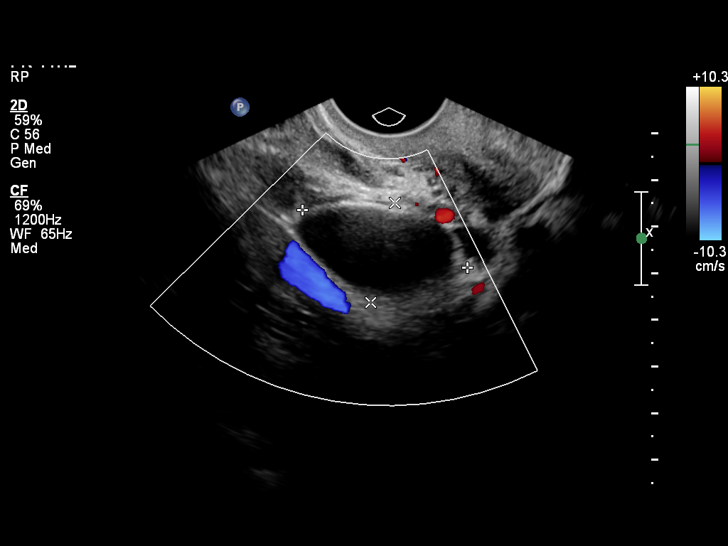
[im 46/61]
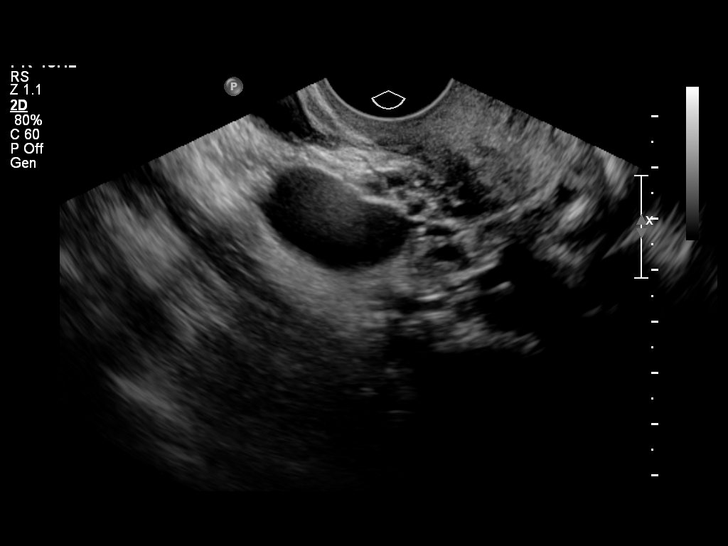
[im 51/61]
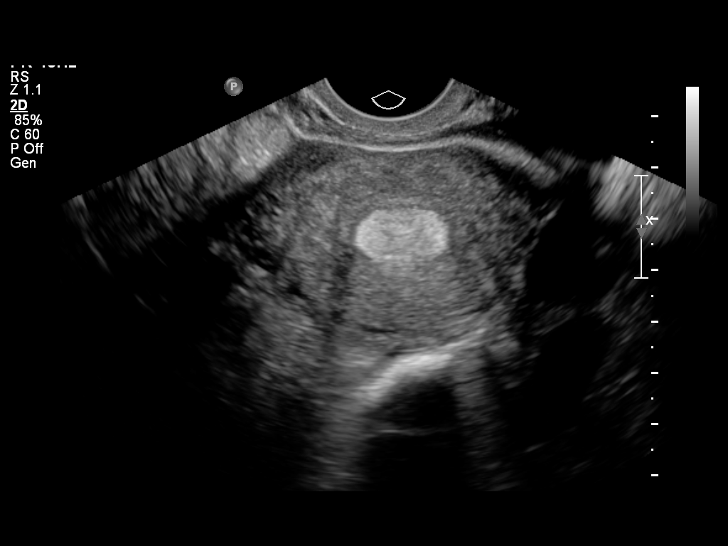
[im 56/61]
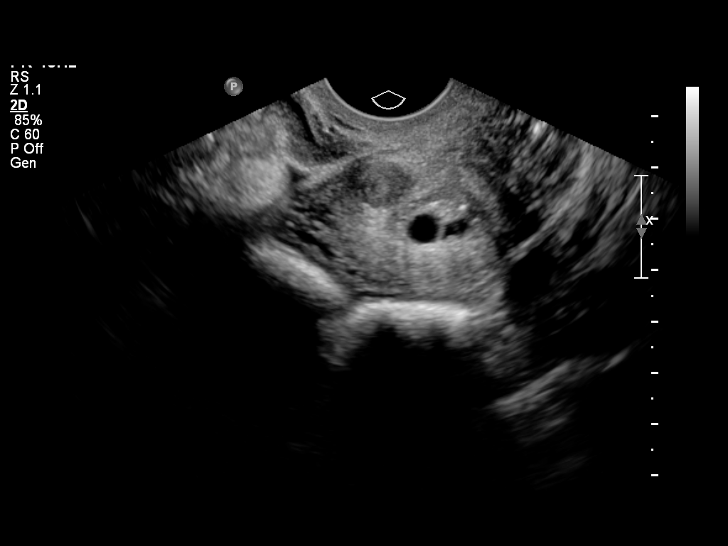
[im 61/61]
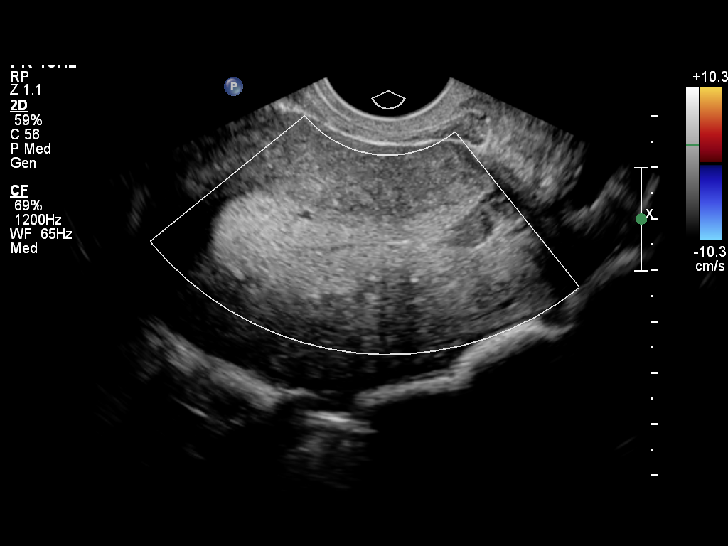

[13 of 25 positions shown; findings below may reference images not displayed]

It was necessary to proceed with endovaginal exam following the
transabdominal exam to visualize the endometrium and ovaries.
FINDINGS: Uterus: Measures 10.5 x 5.5 x 7.5 cm.  Anteverted.  Contains two
small fibroids, intramural in position, the largest in the right
fundus measuring 1.4 x 1.5 x 1.3 cm.  A 1.0 x 1.2 x 1.3 cm
intramural fibroid is in the lower anterior uterine segment.
Otherwise, the echogenicity of the uterus is homogeneous.

Endometrium: The endometrium is echogenic and thickened.  It
measures up to 2.0 cm at the uterine fundus.

Right ovary:  Normal appearance/no adnexal mass.

Left ovary: Measures 3.0 x 2.2 x 3.4 cm and contains a 3.4 x 1.7 x
2.7 cm simple cyst.

Other findings: No free fluid
IMPRESSION: 1.  Endometrial thickening.  Endometrial thickness measures up to
2.0 cm. Consider repeat ultrasound immediately following the
patient's next menses.  The endometrial thickening persist, then
further evaluation with sonohysterogram may be useful to evaluate
for focal versus diffuse endometrial thickening.
2. 3.4 cm simple left ovarian cyst.
3.  Too small intramural fibroids.

## 2013-06-18 ENCOUNTER — Encounter: Payer: Self-pay | Admitting: Obstetrics & Gynecology

## 2013-06-20 ENCOUNTER — Encounter: Payer: Self-pay | Admitting: Obstetrics & Gynecology

## 2013-06-20 ENCOUNTER — Other Ambulatory Visit: Payer: Self-pay | Admitting: Obstetrics & Gynecology

## 2013-06-20 ENCOUNTER — Ambulatory Visit (INDEPENDENT_AMBULATORY_CARE_PROVIDER_SITE_OTHER): Payer: BC Managed Care – PPO | Admitting: Obstetrics & Gynecology

## 2013-06-20 VITALS — BP 118/76 | HR 60 | Resp 16 | Ht 64.5 in | Wt 108.6 lb

## 2013-06-20 DIAGNOSIS — Z0000 Encounter for general adult medical examination without abnormal findings: Secondary | ICD-10-CM

## 2013-06-20 DIAGNOSIS — Z1239 Encounter for other screening for malignant neoplasm of breast: Secondary | ICD-10-CM

## 2013-06-20 DIAGNOSIS — Z124 Encounter for screening for malignant neoplasm of cervix: Secondary | ICD-10-CM

## 2013-06-20 DIAGNOSIS — N951 Menopausal and female climacteric states: Secondary | ICD-10-CM

## 2013-06-20 DIAGNOSIS — Z1231 Encounter for screening mammogram for malignant neoplasm of breast: Secondary | ICD-10-CM

## 2013-06-20 DIAGNOSIS — Z01419 Encounter for gynecological examination (general) (routine) without abnormal findings: Secondary | ICD-10-CM

## 2013-06-20 LAB — COMPREHENSIVE METABOLIC PANEL
ALT: 16 U/L (ref 0–35)
AST: 26 U/L (ref 0–37)
Albumin: 4.6 g/dL (ref 3.5–5.2)
Alkaline Phosphatase: 74 U/L (ref 39–117)
BUN: 14 mg/dL (ref 6–23)
CO2: 22 mEq/L (ref 19–32)
Calcium: 9.6 mg/dL (ref 8.4–10.5)
Chloride: 96 mEq/L (ref 96–112)
Creat: 0.59 mg/dL (ref 0.50–1.10)
Glucose, Bld: 104 mg/dL — ABNORMAL HIGH (ref 70–99)
Potassium: 4.3 mEq/L (ref 3.5–5.3)
Sodium: 131 mEq/L — ABNORMAL LOW (ref 135–145)
Total Bilirubin: 0.6 mg/dL (ref 0.3–1.2)
Total Protein: 7.1 g/dL (ref 6.0–8.3)

## 2013-06-20 LAB — POCT URINALYSIS DIPSTICK
Bilirubin, UA: NEGATIVE
Blood, UA: NEGATIVE
Glucose, UA: NEGATIVE
Ketones, UA: NEGATIVE
Nitrite, UA: NEGATIVE
Protein, UA: NEGATIVE
Urobilinogen, UA: NEGATIVE
pH, UA: 5

## 2013-06-20 LAB — LIPID PANEL
Cholesterol: 242 mg/dL — ABNORMAL HIGH (ref 0–200)
HDL: 89 mg/dL (ref >39)
LDL Cholesterol: 142 mg/dL — ABNORMAL HIGH (ref 0–99)
Total CHOL/HDL Ratio: 2.7 Ratio
Triglycerides: 54 mg/dL (ref <150)
VLDL: 11 mg/dL (ref 0–40)

## 2013-06-20 LAB — TSH: TSH: 1.005 u[IU]/mL (ref 0.350–4.500)

## 2013-06-20 LAB — HEMOGLOBIN, FINGERSTICK: Hemoglobin, fingerstick: 14 g/dL (ref 12.0–16.0)

## 2013-06-20 NOTE — Progress Notes (Signed)
57 y.o. G4P3 MarriedCaucasianF here for annual exam.  Still seeing Dr. Pearlean Brownie.  No underlying cause of dizziness has been identified.  Pt stilled HRT last year due to MRI findings of chronic microvascaular ischemia.  Also saw cardiologist at [facility]--Dr. Teryl Lucy.  Reviewed report with pt here today.  Has PFO.  On ASA.  Dr. Elesa Massed is not seeing pt regularly at this point.  Still having some hot flashes.  She and I discussed SSRI use last year.  She never started it but would like to consider it or something herbal first.  Micah Flesher to [facility] due to continued dizziness.  It was not a great experience for her.  Not going there any longer.  Feels hot flashes make the dizziness worse.    No vaginal bleeding now in over a year.    Patient's last menstrual period was 05/26/2012.          Sexually active: yes  The current method of family planning is vasectomy.    Exercising: yes  run, muscle, and tennis Smoker:  no  Health Maintenance: Pap:  2012-WNL History of abnormal Pap:  no MMG:  2012 normal Colonoscopy:  2013 repeat in 5 years BMD:   none TDaP:  Dr Consuella Lose Griffin-PCP-thinks up to date Screening Labs: today, Hb today: 14.0, Urine today: RBC-trace   reports that she has never smoked. She has never used smokeless tobacco. She reports that she drinks about 2.5 ounces of alcohol per week. She reports that she does not use illicit drugs.  Past Medical History  Diagnosis Date  . Anemia   . Migraine   . Abnormal brain MRI     off HRT  . Congenital heart anomaly     recent diag with hole in heart    Past Surgical History  Procedure Laterality Date  . Cesarean section      x 3  . Cervical fusion      C5/6  . Hysteroscopy w/d&c  10/18/2011    Procedure: DILATATION AND CURETTAGE /HYSTEROSCOPY;  Surgeon: Jerene Bears, MD;  Location: [facility];  Service: Gynecology;  Laterality: N/A;    Current Outpatient Prescriptions  Medication Sig Dispense Refill  . aspirin 81 MG tablet Take 81  mg by mouth daily.      . calcium carbonate (OS-CAL) 600 MG TABS Take 600 mg by mouth daily.      . ferrous sulfate 325 (65 FE) MG tablet Take 325 mg by mouth daily with breakfast.      . Omega-3 Fatty Acids (FISH OIL) 1200 MG CAPS Take 2 capsules by mouth daily.       No current facility-administered medications for this visit.    Family History  Problem Relation Age of Onset  . Heart disease Mother   . Heart disease Father   . Heart disease Brother   . Diabetes Mother   . Diabetes Maternal Grandmother   . Hypertension Mother     ROS:  Pertinent items are noted in HPI.  Otherwise, a comprehensive ROS was negative.  Exam:   BP 118/76  Pulse 60  Resp 16  Ht 5' 4.5" (1.638 m)  Wt 108 lb 9.6 oz (49.261 kg)  BMI 18.36 kg/m2  LMP 05/26/2012  Weight change: -1lb  Height: 5' 4.5" (163.8 cm)  Ht Readings from Last 3 Encounters:  06/20/13 5' 4.5" (1.638 m)  04/17/12 5\' 4"  (1.626 m)  04/03/12 5\' 4"  (1.626 m)    General appearance: alert, cooperative  and appears stated age Head: Normocephalic, without obvious abnormality, atraumatic Neck: no adenopathy, supple, symmetrical, trachea midline and thyroid normal to inspection and palpation Lungs: clear to auscultation bilaterally Breasts: normal appearance, no masses or tenderness Heart: regular rate and rhythm Abdomen: soft, non-tender; bowel sounds normal; no masses,  no organomegaly Extremities: extremities normal, atraumatic, no cyanosis or edema Skin: Skin color, texture, turgor normal. No rashes or lesions Lymph nodes: Cervical, supraclavicular, and axillary nodes normal. No abnormal inguinal nodes palpated Neurologic: Grossly normal   Pelvic: External genitalia:  no lesions              Urethra:  normal appearing urethra with no masses, tenderness or lesions              Bartholins and Skenes: normal                 Vagina: normal appearing vagina with normal color and discharge, no lesions              Cervix: no  lesions              Pap taken: yes Bimanual Exam:  Uterus:  normal size, contour, position, consistency, mobility, non-tender              Adnexa: normal adnexa and no mass, fullness, tenderness               Rectovaginal: Confirms               Anus:  normal sphincter tone, no lesions  A:  Well Woman with normal exam H/O PMP bleeding, resolved off HRT H/O 3 cesarean sections H/O anemia, resolved H/O dizziness of unknow etiology Symptomatic menopause  P:   Mammogram yearly.   pap smear with HR HPV today CMP, lipids, TSH, Vit D FSH Pt will try [place] W[place] first.  She will give me update in 4 weeks.  If no improvement, will start 50mg  Zoloft daily.  return annually or prn  An After Visit Summary was printed and given to the patient.

## 2013-06-20 NOTE — Patient Instructions (Signed)

## 2013-06-21 LAB — FOLLICLE STIMULATING HORMONE: FSH: 87.9 m[IU]/mL

## 2013-06-21 LAB — VITAMIN D 25 HYDROXY (VIT D DEFICIENCY, FRACTURES): Vit D, 25-Hydroxy: 43 ng/mL (ref 30–89)

## 2013-06-25 LAB — IPS PAP TEST WITH HPV

## 2013-06-27 ENCOUNTER — Telehealth: Payer: Self-pay

## 2013-06-27 NOTE — Telephone Encounter (Signed)
Patient is returning Kelly's call.

## 2013-06-27 NOTE — Telephone Encounter (Signed)
Message copied by Elisha Headland on Wed Jun 27, 2013  2:21 PM ------      Message from: Jerene Bears      Created: Tue Jun 26, 2013  9:44 PM       02 pap recall.  Inform she is clearly in menopause as FSH is 87.  She needs to call if has any bleeding.  CMP with mildly elevated glucose but she wasn't fasting.  No need to repeat.  Cholesterol total is 242.  LDLs 142 but HDLs are 89 and ratio normal.  No need for treatment.  TSH and Vit D normal. ------

## 2013-06-27 NOTE — Telephone Encounter (Signed)
Lmtcb//kn 

## 2013-07-04 NOTE — Telephone Encounter (Signed)
Patient is returning Kelly's call.

## 2013-07-05 NOTE — Telephone Encounter (Signed)
Patient notified of all lab results.

## 2013-07-09 NOTE — Telephone Encounter (Signed)
Patient notified of results.

## 2013-07-25 ENCOUNTER — Ambulatory Visit
Admission: RE | Admit: 2013-07-25 | Discharge: 2013-07-25 | Disposition: A | Discharge status: Home / Self Care | Payer: BC Managed Care – PPO | Source: Ambulatory Visit | Attending: Obstetrics & Gynecology | Admitting: Obstetrics & Gynecology

## 2013-07-25 ENCOUNTER — Ambulatory Visit: Payer: BC Managed Care – PPO

## 2013-07-25 DIAGNOSIS — Z1239 Encounter for other screening for malignant neoplasm of breast: Secondary | ICD-10-CM

## 2013-08-02 ENCOUNTER — Other Ambulatory Visit: Payer: Self-pay | Admitting: Obstetrics & Gynecology

## 2013-08-02 DIAGNOSIS — R928 Other abnormal and inconclusive findings on diagnostic imaging of breast: Secondary | ICD-10-CM

## 2013-08-06 ENCOUNTER — Telehealth: Payer: Self-pay | Admitting: Obstetrics & Gynecology

## 2013-08-06 NOTE — Telephone Encounter (Signed)
Spoke with patient. She received a letter from the [facility] and was advised that she had dense breasts. She wanted to know what that meant in terms of her needing follow up. Last mammogram birads-0, needs R Dx MMG and R u/s, which patient has appointment for. She wanted to know if Dr. Sabra Heck would suggest that she have these follow up tests done, she is concerned about radiation exposure. I advised yes, that the follow up appointment is very important and exposure to radiation is very minimal and risks of not completing follow up is worse than risks of exposure to radiation. Advised patient, can discuss breast density at annual exam and Dr. Sabra Heck may recommend 3 D Mammogram in the future.  Patient is agreeable and I advised her to call back if any further questions and that she would be given results of Dx MMG and R Breast U/s at time of appointment.   Routing to provider for final review. Patient agreeable to disposition. Will close encounter

## 2013-08-06 NOTE — Telephone Encounter (Signed)
Pt had a mammogram done and she now has some questions for Dr. Sabra Heck.

## 2013-08-06 NOTE — Telephone Encounter (Signed)
Message left to return call to Lundy Cozart at [phone].    

## 2013-08-09 ENCOUNTER — Ambulatory Visit
Admission: RE | Admit: 2013-08-09 | Discharge: 2013-08-09 | Disposition: A | Discharge status: Home / Self Care | Payer: BC Managed Care – PPO | Source: Ambulatory Visit | Attending: Obstetrics & Gynecology | Admitting: Obstetrics & Gynecology

## 2013-08-09 DIAGNOSIS — R928 Other abnormal and inconclusive findings on diagnostic imaging of breast: Secondary | ICD-10-CM

## 2013-08-10 NOTE — Telephone Encounter (Signed)
Called pt personally to make sure she didn't have any additional questions.  She has follow-up yesterday and results are normal.  Can remove from hold.  No recall needed.  Recommended 3D MMG next year due to dense breasts.  All questions answered.  Pt appreciative of phone call.  Encounter closed.

## 2013-08-14 ENCOUNTER — Telehealth: Payer: Self-pay | Admitting: Obstetrics & Gynecology

## 2013-08-14 NOTE — Telephone Encounter (Signed)
Per Dr. Ammie Ferrier notes:  P: Mammogram yearly.  pap smear with HR HPV today  CMP, lipids, TSH, Vit D  FSH  Pt will try [place] first. She will give me update in 4 weeks. If no improvement, will start 50mg  Zoloft daily.  return annually or prn

## 2013-08-14 NOTE — Telephone Encounter (Signed)
LMTCB

## 2013-08-14 NOTE — Telephone Encounter (Signed)
Patient is returning a call to Amy. °

## 2013-08-14 NOTE — Telephone Encounter (Signed)
Spoke with patient.  She states she is still having hot flashes with [employer]. Advised of message below from Dr. Sabra Heck regarding next step. Patient is agreeable. She currently has a new rx from another provider that she saw prior which she states is Generic Zoloft (Sertraline) 50 mg tablet that she will start taking. Advised to call back in one month to let us know how she is doing and if she needs refills. Patient is agreeable to plan.  Okay to start Zoloft at this time?

## 2013-08-14 NOTE — Telephone Encounter (Signed)
Patient was taking Barney but it is not working and wants to know what to try next. Patient stated Dr. Sabra Heck told her to call and ask for her directly.

## 2013-08-14 NOTE — Telephone Encounter (Signed)
Yes this is fine.  Can close encounter after discussion with pt.

## 2013-08-15 NOTE — Telephone Encounter (Signed)
Patient aware, she will start Zoloft and call back with update.

## 2013-10-22 ENCOUNTER — Telehealth: Payer: Self-pay | Admitting: Obstetrics & Gynecology

## 2013-10-22 NOTE — Telephone Encounter (Signed)
Spoke with patient. Patient states that she started Zoloft on 08/14/13 and "It is just not working for me. I think I need to try something new." States that she has tried different doses of Zoloft and nothing has worked.Patient would like to try Celexa as a friend has recommended this.   Dr.Miller, Okay to switch patient to Celexa?

## 2013-10-22 NOTE — Telephone Encounter (Signed)
Left message to call Kaitlyn at [phone]. 

## 2013-10-22 NOTE — Telephone Encounter (Signed)
Patient states Sertraline is "not working." She has some questions about another RX her friend suggested.  [place]

## 2013-10-23 MED ORDER — CITALOPRAM HYDROBROMIDE 20 MG PO TABS: 20.0000 mg | ORAL_TABLET | Freq: Every day | ORAL | Status: DC

## 2013-10-23 NOTE — Telephone Encounter (Signed)
RX for Celexa 20mg  to pharmacy.  OK to just switch.  Doesn't need to taper off Zoloft.  Just so she knows, that was a very low dose of Zoloft and it can be increased to much high dosage.  I would prefer with Celexa to increase dosage if doesn't work vs changing medications.  Feel she should be seen in 4 weeks for appt to discuss if helping.

## 2013-10-23 NOTE — Telephone Encounter (Signed)
Spoke with patient. Message from [place] given. Patient states that she will call back to get Celexa dose increased if she does not feel it is working for her. Advised we would need to see her for a follow up in four weeks to discuss the medication change. Requesting afternoon appointment. Appointment made for 4/28 at 1300 with Dr.Miller. Patient verbalizes understanding and is agreeable.  Routing to provider for final review. Patient agreeable to disposition. Will close encounter

## 2013-11-20 ENCOUNTER — Ambulatory Visit (INDEPENDENT_AMBULATORY_CARE_PROVIDER_SITE_OTHER): Payer: BC Managed Care – PPO | Admitting: Obstetrics & Gynecology

## 2013-11-20 ENCOUNTER — Encounter: Payer: Self-pay | Admitting: Obstetrics & Gynecology

## 2013-11-20 VITALS — BP 100/62 | HR 58 | Resp 16 | Ht 64.5 in | Wt 109.0 lb

## 2013-11-20 DIAGNOSIS — N951 Menopausal and female climacteric states: Secondary | ICD-10-CM

## 2013-11-20 MED ORDER — CITALOPRAM HYDROBROMIDE 10 MG PO TABS: 30.0000 mg | ORAL_TABLET | Freq: Every day | ORAL | Status: DC

## 2013-11-20 MED ORDER — DIAZEPAM 5 MG PO TABS: ORAL_TABLET | ORAL | Status: AC

## 2013-11-20 NOTE — Progress Notes (Signed)
Patient ID: LEVEDA KENDRIX, female   DOB: January 15, 1956, 58 y.o.   MRN: [id number]  58 y.o. Married Caucasian female G4P3 here for follow up after starting Celexa.  Was taking 20mg  daily but increased to 30mg  daily.  She feels this dosage has really made a difference for hot flashes.  Night sweats are definitely better.  Sleeping is better, as well, since increasing dosage.  She initially started on Zoloft but never increased the dosage.  She felt this didn't make much of a difference.  Does have some ambien but when she takes this, she takes 1/3 to 1/4 tablet when she does take it.  She feels this may be primarily placebo effect but this works for her.  No vaginal bleeding.    O: Healthy WD,WN female Affect:  normal  A:Hot flashes, Vasomotor symptoms  P: Continue Celexa 30mg  daily.  Rx for 3 tabs of 10mg  daily.  #90/12 RF Valium 5mg  as needed.  #30/ORF.  ~15 minutes spent with patient >50% of time was in face to face discussion of above.

## 2013-12-04 ENCOUNTER — Ambulatory Visit (INDEPENDENT_AMBULATORY_CARE_PROVIDER_SITE_OTHER): Payer: BC Managed Care – PPO | Admitting: Internal Medicine

## 2013-12-04 VITALS — BP 106/72 | HR 67 | Temp 97.8°F | Resp 18 | Ht 64.0 in | Wt 106.0 lb

## 2013-12-04 DIAGNOSIS — H5789 Other specified disorders of eye and adnexa: Secondary | ICD-10-CM

## 2013-12-04 LAB — POCT CBC
Granulocyte percent: 55.9 %G (ref 37–80)
HCT, POC: 41.5 % (ref 37.7–47.9)
Hemoglobin: 13.5 g/dL (ref 12.2–16.2)
Lymph, poc: 2 (ref 0.6–3.4)
MCH, POC: 30.6 pg (ref 27–31.2)
MCHC: 32.5 g/dL (ref 31.8–35.4)
MCV: 94.2 fL (ref 80–97)
MID (cbc): 0.4 (ref 0–0.9)
MPV: 8.4 fL (ref 0–99.8)
POC Granulocyte: 3.1 (ref 2–6.9)
POC LYMPH PERCENT: 36.7 %L (ref 10–50)
POC MID %: 7.4 %M (ref 0–12)
Platelet Count, POC: 294 10*3/uL (ref 142–424)
RBC: 4.41 M/uL (ref 4.04–5.48)
RDW, POC: 14.1 %
WBC: 5.5 10*3/uL (ref 4.6–10.2)

## 2013-12-04 NOTE — Patient Instructions (Signed)
Immunization Schedule, Adult  Influenza vaccine.  All adults should be immunized every year.  All adults, including pregnant women and people with hives-only allergy to eggs can receive the inactivated influenza (IIV) vaccine.  Adults aged 57 49 years can receive the recombinant influenza (RIV) vaccine. The RIV vaccine does not contain any egg protein.  Adults aged 65 years or older can receive the standard-dose IIV or the high-dose IIV.  Tetanus, diphtheria, and acellular pertussis (Td, Tdap) vaccine.  Pregnant women should receive 1 dose of Tdap vaccine during each pregnancy. The dose should be obtained regardless of the length of time since the last dose. Immunization is preferred during the 27th to 36th week of gestation.  An adult who has not previously received Tdap or who does not know his or her vaccine status should receive 1 dose of Tdap. This initial dose should be followed by tetanus and diphtheria toxoids (Td) booster doses every 10 years.  Adults with an unknown or incomplete history of completing a 3-dose immunization series with Td-containing vaccines should begin or complete a primary immunization series including a Tdap dose.  Adults should receive a Td booster every 10 years.  Varicella vaccine.  An adult without evidence of immunity to varicella should receive 2 doses or a second dose if he or she has previously received 1 dose.  Pregnant females who do not have evidence of immunity should receive the first dose after pregnancy. This first dose should be obtained before leaving the health care facility. The second dose should be obtained 4 8 weeks after the first dose.  Human papillomavirus (HPV) vaccine.  Females aged 13 26 years who have not received the vaccine previously should obtain the 3-dose series.  The vaccine is not recommended for use in pregnant females. However, pregnancy testing is not needed before receiving a dose. If a female is found to be  pregnant after receiving a dose, no treatment is needed. In that case, the remaining doses should be delayed until after the pregnancy.  Males aged 13 21 years who have not received the vaccine previously should receive the 3-dose series. Males aged 22 26 years may be immunized.  Immunization is recommended through the age of 26 years for any female who has sex with males and did not get any or all doses earlier.  Immunization is recommended for any person with an immunocompromised condition through the age of 26 years if he or she did not get any or all doses earlier.  During the 3-dose series, the second dose should be obtained 4 8 weeks after the first dose. The third dose should be obtained 24 weeks after the first dose and 16 weeks after the second dose.  Zoster vaccine.  One dose is recommended for adults aged 60 years or older unless certain conditions are present.  Measles, mumps, and rubella (MMR) vaccine.  Adults born before 1957 generally are considered immune to measles and mumps.  Adults born in 1957 or later should have 1 or more doses of MMR vaccine unless there is a contraindication to the vaccine or there is laboratory evidence of immunity to each of the three diseases.  A routine second dose of MMR vaccine should be obtained at least 28 days after the first dose for students attending postsecondary schools, health care workers, or international travelers.  People who received inactivated measles vaccine or an unknown type of measles vaccine during 1963 1967 should receive 2 doses of MMR vaccine.  People who received   inactivated mumps vaccine or an unknown type of mumps vaccine before 1979 and are at high risk for mumps infection should consider immunization with 2 doses of MMR vaccine.  For females of childbearing age, rubella immunity should be determined. If there is no evidence of immunity, females who are not pregnant should be vaccinated. If there is no evidence of  immunity, females who are pregnant should delay immunization until after pregnancy.  Unvaccinated health care workers born before 45 who lack laboratory evidence of measles, mumps, or rubella immunity or laboratory confirmation of disease should consider measles and mumps immunization with 2 doses of MMR vaccine or rubella immunization with 1 dose of MMR vaccine.  Pneumococcal 13-valent conjugate (PCV13) vaccine.  When indicated, a person who is uncertain of his or her immunization history and has no record of immunization should receive the PCV13 vaccine.  An adult aged 38 years or older who has certain medical conditions and has not been previously immunized should receive 1 dose of PCV13 vaccine. This PCV13 should be followed with a dose of pneumococcal polysaccharide (PPSV23) vaccine. The PPSV23 vaccine dose should be obtained at least 8 weeks after the dose of PCV13 vaccine.  An adult aged 33 years or older who has certain medical conditions and previously received 1 or more doses of PPSV23 vaccine should receive 1 dose of PCV13. The PCV13 vaccine dose should be obtained 1 or more years after the last PPSV23 vaccine dose.  Pneumococcal polysaccharide (PPSV23) vaccine.  When PCV13 is also indicated, PCV13 should be obtained first.  All adults aged 71 years and older should be immunized.  An adult younger than age 74 years who has certain medical conditions should be immunized.  Any person who resides in a nursing home or long-term care facility should be immunized.  An adult smoker should be immunized.  People with an immunocompromised condition and certain other conditions should receive both PCV13 and PPSV23 vaccines.  People with human immunodeficiency virus (HIV) infection should be immunized as soon as possible after diagnosis.  Immunization during chemotherapy or radiation therapy should be avoided.  Routine use of PPSV23 vaccine is not recommended for American Indians,  [place] Natives, or people younger than 65 years unless there are medical conditions that require PPSV23 vaccine.  When indicated, people who have unknown immunization and have no record of immunization should receive PPSV23 vaccine.  One-time revaccination 5 years after the first dose of PPSV23 is recommended for people aged 22 64 years who have chronic kidney failure, nephrotic syndrome, asplenia, or immunocompromised conditions.  People who received 1 2 doses of PPSV23 before age 69 years should receive another dose of PPSV23 vaccine at age 70 years or later if at least 5 years have passed since the previous dose.  Doses of PPSV23 are not needed for people immunized with PPSV23 at or after age 66 years.  Meningococcal vaccine.  Adults with asplenia or persistent complement component deficiencies should receive 2 doses of quadrivalent meningococcal conjugate (MenACWY-D) vaccine. The doses should be obtained at least 2 months apart.  Microbiologists working with certain meningococcal bacteria, [place] recruits, people at risk during an outbreak, and people who travel to or live in countries with a high rate of meningitis should be immunized.  A first-year college student up through age 74 years who is living in a residence hall should receive a dose if he or she did not receive a dose on or after his or her 16th birthday.  Adults who have  certain high-risk conditions should receive one or more doses of vaccine.  Hepatitis A vaccine.  Adults who wish to be protected from this disease, have certain high-risk conditions, work with hepatitis A-infected animals, work in hepatitis A research labs, or travel to or work in countries with a high rate of hepatitis A should be immunized.  Adults who were previously unvaccinated and who anticipate close contact with an international adoptee during the first 60 days after arrival in the [place] from a country with a high rate of hepatitis A should  be immunized.  Hepatitis B vaccine.  Adults who wish to be protected from this disease, have certain high-risk conditions, may be exposed to blood or other infectious body fluids, are household contacts or sex partners of hepatitis B positive people, are clients or workers in certain care facilities, or travel to or work in countries with a high rate of hepatitis B should be immunized.  Haemophilus influenzae type b (Hib) vaccine.  A previously unvaccinated person with asplenia or sickle cell disease or having a scheduled splenectomy should receive 1 dose of Hib vaccine.  Regardless of previous immunization, a recipient of a hematopoietic stem cell transplant should receive a 3-dose series 6 12 months after his or her successful transplant.  Hib vaccine is not recommended for adults with HIV infection. Document Released: 10/02/2003 Document Revised: 11/06/2012 Document Reviewed: 08/29/2012 [facility] Patient Information 2014 [place], [place].

## 2013-12-04 NOTE — Progress Notes (Signed)
   Subjective:    Patient ID: Angela Snyder, female    DOB: 05-Feb-1956, 58 y.o.   MRN: [id number]  HPI Eye: Left eye x 3 days, morning eyes are unclear, she is complaining of a red streak under her eye that has been present for 2 days, with mild edema which has been present for one day.   She has had watery eyes. Denies crusting. She is able to see clearly however.  Did  Call opth and suggested she use eye drops, systane. Denies fever, nausea, headaches, ear pain, facial pressure.   She seems to have several issues happening lately, when she recovers from one issue, another occurs over the prior six months.   Had GI bug recently, but has recovered   [facility]  Dr. Laurann [place] Review of Systems     Objective:   Physical Exam  Vitals reviewed. Constitutional: She is oriented to person, place, and time. She appears well-developed and well-nourished.  HENT:  Head: Normocephalic.  Eyes: Conjunctivae, EOM and lids are normal. Pupils are equal, round, and reactive to light. Right eye exhibits no discharge, no exudate and no hordeolum. Left eye exhibits no discharge, no exudate and no hordeolum. No scleral icterus.    Puffy under left eye  Neck: Normal range of motion.  Cardiovascular: Normal rate.   Pulmonary/Chest: Effort normal.  Neurological: She is alert and oriented to person, place, and time. She exhibits normal muscle tone. Coordination normal.  Skin: Skin is warm. Rash noted. Rash is macular. There is erythema.     Puffy, erythema under left eye  No tenderness or discharge.  Psychiatric: She has a normal mood and affect. Her behavior is normal. Judgment and thought content normal.   No preauricular node Results for orders placed in visit on 12/04/13  POCT CBC      Result Value Ref Range   WBC 5.5  4.6 - 10.2 K/uL   Lymph, poc 2.0  0.6 - 3.4   POC LYMPH PERCENT 36.7  10 - 50 %L   MID (cbc) 0.4  0 - 0.9   POC MID % 7.4  0 - 12 %M   POC Granulocyte 3.1  2 -  6.9   Granulocyte percent 55.9  37 - 80 %G   RBC 4.41  4.04 - 5.48 M/uL   Hemoglobin 13.5  12.2 - 16.2 g/dL   HCT, POC 41.5  37.7 - 47.9 %   MCV 94.2  80 - 97 fL   MCH, POC 30.6  27 - 31.2 pg   MCHC 32.5  31.8 - 35.4 g/dL   RDW, POC 14.1     Platelet Count, POC 294  142 - 424 K/uL   MPV 8.4  0 - 99.8 fL         Assessment & Plan:  Possible contusion left inferior orbit Cold compress/artificial tears/lacrilube

## 2014-07-12 ENCOUNTER — Ambulatory Visit (INDEPENDENT_AMBULATORY_CARE_PROVIDER_SITE_OTHER): Payer: BC Managed Care – PPO | Admitting: Obstetrics & Gynecology

## 2014-07-12 ENCOUNTER — Encounter: Payer: Self-pay | Admitting: Obstetrics & Gynecology

## 2014-07-12 VITALS — BP 112/76 | HR 60 | Resp 16 | Ht 64.5 in | Wt 113.0 lb

## 2014-07-12 DIAGNOSIS — Z01419 Encounter for gynecological examination (general) (routine) without abnormal findings: Secondary | ICD-10-CM

## 2014-07-12 MED ORDER — CITALOPRAM HYDROBROMIDE 20 MG PO TABS: 40.0000 mg | ORAL_TABLET | Freq: Every day | ORAL | Status: DC

## 2014-07-12 NOTE — Progress Notes (Signed)
58 y.o. G4P3 MarriedCaucasianF here for annual exam.  Doing well.  Still having mild dizziness.  Neurologic and Cardiology evaluations were all negative.  Pt did go to [facility] and felt like they did "nothing" for me.  Pt is off HRT.  No vaginal bleeding.  Having some hot flashes.  On Celexa 30mg  daily.  This has helped but did not eliminate the hot flashes.  Having a little anxiety.    PCP:  Kelton Pillar.  Has appt in January.    Patient's last menstrual period was 12/25/2011.          Sexually active: Yes.    The current method of family planning is vasectomy and post menopausal status.    Exercising: Yes.    Run, tennis, weights Smoker:  no  Health Maintenance: Pap:  05/2013 Neg. HR HPV:Neg History of abnormal Pap:  no MMG:  08/09/13 BIRADS1:neg Colonoscopy:  03/2012 polyps. Repeat 5 years  BMD:   2011 TDaP: PCP  Screening Labs: PCP, Hb today: PCP, Urine today: PCP   reports that she has never smoked. She has never used smokeless tobacco. She reports that she drinks about 7.2 - 8.4 oz of alcohol per week. She reports that she does not use illicit drugs.  Past Medical History  Diagnosis Date  . Anemia   . Migraine   . Abnormal brain MRI     off HRT  . PFO (patent foramen ovale)     Dr. Jeani Hawking Ward    Past Surgical History  Procedure Laterality Date  . Cesarean section      x 3  . Cervical fusion      C5/6  . Hysteroscopy w/d&c  10/18/2011    Procedure: DILATATION AND CURETTAGE /HYSTEROSCOPY;  Surgeon: Megan Salon, MD;  Location: [facility];  Service: Gynecology;  Laterality: N/A;    Current Outpatient Prescriptions  Medication Sig Dispense Refill  . aspirin 81 MG tablet Take 81 mg by mouth daily.    . citalopram (CELEXA) 10 MG tablet Take 3 tablets (30 mg total) by mouth daily. 90 tablet 12  . diazepam (VALIUM) 5 MG tablet 1/2 tablet as needed. 30 tablet 0  . ferrous sulfate 325 (65 FE) MG tablet Take 325 mg by mouth daily with breakfast.    . Nutritional Supplements  (NUTRITIONAL SUPPLEMENT PO) Take by mouth. Memory Supplement     No current facility-administered medications for this visit.    Family History  Problem Relation Age of Onset  . Heart disease Mother   . Heart disease Father   . Heart disease Brother   . Diabetes Mother   . Diabetes Maternal Grandmother   . Hypertension Mother     ROS:  Pertinent items are noted in HPI.  Otherwise, a comprehensive ROS was negative.  Exam:   BP 112/76 mmHg  Pulse 60  Resp 16  Ht 5' 4.5" (1.638 m)  Wt 113 lb (51.256 kg)  BMI 19.10 kg/m2  LMP 12/25/2011  Weight change: +5#   Height: 5' 4.5" (163.8 cm)  Ht Readings from Last 3 Encounters:  07/12/14 5' 4.5" (1.638 m)  12/04/13 5\' 4"  (1.626 m)  11/20/13 5' 4.5" (1.638 m)    General appearance: alert, cooperative and appears stated age Head: Normocephalic, without obvious abnormality, atraumatic Neck: no adenopathy, supple, symmetrical, trachea midline and thyroid normal to inspection and palpation Lungs: clear to auscultation bilaterally Breasts: normal appearance, no masses or tenderness Heart: regular rate and rhythm Abdomen: soft, non-tender; bowel sounds  normal; no masses,  no organomegaly Extremities: extremities normal, atraumatic, no cyanosis or edema Skin: Skin color, texture, turgor normal. No rashes or lesions Lymph nodes: Cervical, supraclavicular, and axillary nodes normal. No abnormal inguinal nodes palpated Neurologic: Grossly normal   Pelvic: External genitalia:  no lesions              Urethra:  normal appearing urethra with no masses, tenderness or lesions              Bartholins and Skenes: normal                 Vagina: normal appearing vagina with normal color and discharge, no lesions              Cervix: no lesions              Pap taken: No. Bimanual Exam:  Uterus:  normal size, contour, position, consistency, mobility, non-tender              Adnexa: normal adnexa and no mass, fullness, tenderness                Rectovaginal: Confirms               Anus:  normal sphincter tone, no lesions  Chaperone was present for exam.  A:  Well Woman with normal exam H/O PMP bleeding, resolved off HRT H/O 3 cesarean sections H/O anemia, resolved H/O dizziness of unknow etiology with neurologic and cardiac evaluations that were negative.   Hot flashes Elevated LDLs.  Declines blood work today.  P: Mammogram yearly.  Pt aware due. pap smear with HR HPV 2014.  No pap today. On Celexa.  Will increase to 40mg  daily.  RFs to pharmacy.  Pt will call with any side effects.     Will see Dr. Laurann Montana in January. return annually or prn

## 2015-07-31 ENCOUNTER — Other Ambulatory Visit: Payer: Self-pay | Admitting: Obstetrics & Gynecology

## 2015-07-31 NOTE — Telephone Encounter (Signed)
Patient calling for refill on Citalopram to [facility] on [facility] at [phone].

## 2015-08-01 ENCOUNTER — Other Ambulatory Visit: Payer: Self-pay

## 2015-08-01 MED ORDER — CITALOPRAM HYDROBROMIDE 20 MG PO TABS: 40.0000 mg | ORAL_TABLET | Freq: Every day | ORAL | Status: DC

## 2015-08-01 NOTE — Telephone Encounter (Signed)
Patient notified

## 2015-08-01 NOTE — Telephone Encounter (Signed)
Patient is calling to check on the status of her refill request. She is completely out of Citalopram.

## 2015-08-01 NOTE — Telephone Encounter (Signed)
Medication refill request: citalopram  Last AEX:  07/12/14 SM Next AEX: 09/12/15 SM Last MMG (if hormonal medication request): 08/09/13 BIRADS1:neg Refill authorized: please advise.

## 2015-09-12 ENCOUNTER — Encounter: Payer: Self-pay | Admitting: Obstetrics & Gynecology

## 2015-09-12 ENCOUNTER — Ambulatory Visit (INDEPENDENT_AMBULATORY_CARE_PROVIDER_SITE_OTHER): Payer: BLUE CROSS/BLUE SHIELD | Admitting: Obstetrics & Gynecology

## 2015-09-12 VITALS — BP 110/70 | HR 64 | Resp 14 | Ht 64.25 in | Wt 114.0 lb

## 2015-09-12 DIAGNOSIS — Z01419 Encounter for gynecological examination (general) (routine) without abnormal findings: Secondary | ICD-10-CM | POA: Diagnosis not present

## 2015-09-12 DIAGNOSIS — Z124 Encounter for screening for malignant neoplasm of cervix: Secondary | ICD-10-CM

## 2015-09-12 DIAGNOSIS — Z1239 Encounter for other screening for malignant neoplasm of breast: Secondary | ICD-10-CM

## 2015-09-12 MED ORDER — CITALOPRAM HYDROBROMIDE 40 MG PO TABS: 40.0000 mg | ORAL_TABLET | Freq: Every day | ORAL | Status: DC

## 2015-09-12 NOTE — Progress Notes (Signed)
60 y.o. G4P3 MarriedCaucasianF here for annual exam.  Doing well.  Denies vaginal bleeding.  Denies any new neurologic symptoms.  Has seen a naturopathic provider.  Is on an amino acid supplement.  Went snow skiing this weekend.  Menopausal symptoms are minimal to her.  PCP:  Dr. Laurann Montana.  Has appt next week.    Patient's last menstrual period was 12/25/2011.          Sexually active: Yes.    The current method of family planning is post menopausal status.    Exercising: Yes.    run, tennis, weight training Smoker:  no  Health Maintenance: Pap:  06/20/13 Neg. HR HPV:neg History of abnormal Pap:  no MMG: 08/09/13 Diagnostic Right BIRADS1:neg Colonoscopy: 04/17/12 Polyps  BMD:   Never TDaP: 2016 w/PCP Screening Labs: PCP, Hb today: PCP, Urine today: PCP   reports that she has never smoked. She has never used smokeless tobacco. She reports that she drinks about 7.2 - 8.4 oz of alcohol per week. She reports that she does not use illicit drugs.  Past Medical History  Diagnosis Date  . Anemia   . Migraine   . Abnormal brain MRI     off HRT  . PFO (patent foramen ovale)     Dr. Jeani Hawking Ward    Past Surgical History  Procedure Laterality Date  . Cesarean section      x 3  . Cervical fusion      C5/6  . Hysteroscopy w/d&c  10/18/2011    Procedure: DILATATION AND CURETTAGE /HYSTEROSCOPY;  Surgeon: Megan Salon, MD;  Location: [facility];  Service: Gynecology;  Laterality: N/A;    Current Outpatient Prescriptions  Medication Sig Dispense Refill  . Amino Acids (AMINO ACID PO) Take by mouth. Gabatone K-39 daily    . citalopram (CELEXA) 20 MG tablet Take 2 tablets (40 mg total) by mouth daily. 60 tablet 1  . diazepam (VALIUM) 5 MG tablet 1/2 tablet as needed. 30 tablet 0  . Multiple Vitamin (MULTIVITAMIN) tablet Take 1 tablet by mouth daily.    . Probiotic Product (PROBIOTIC DAILY PO) Take by mouth daily.     No current facility-administered medications for this visit.    Family History   Problem Relation Age of Onset  . Heart disease Mother   . Heart disease Father   . Heart disease Brother   . Diabetes Mother   . Diabetes Maternal Grandmother   . Hypertension Mother     ROS:  Pertinent items are noted in HPI.  Otherwise, a comprehensive ROS was negative.  Exam:   BP 110/70 mmHg  Pulse 64  Resp 14  Ht 5' 4.25" (1.632 m)  Wt 114 lb (51.71 kg)  BMI 19.41 kg/m2  LMP 12/25/2011  Weight change: +1#  Height: 5' 4.25" (163.2 cm)  Ht Readings from Last 3 Encounters:  09/12/15 5' 4.25" (1.632 m)  07/12/14 5' 4.5" (1.638 m)  12/04/13 5\' 4"  (1.626 m)    General appearance: alert, cooperative and appears stated age Head: Normocephalic, without obvious abnormality, atraumatic Neck: no adenopathy, supple, symmetrical, trachea midline and thyroid normal to inspection and palpation Lungs: clear to auscultation bilaterally Breasts: normal appearance, no masses or tenderness Heart: regular rate and rhythm Abdomen: soft, non-tender; bowel sounds normal; no masses,  no organomegaly Extremities: extremities normal, atraumatic, no cyanosis or edema Skin: Skin color, texture, turgor normal. No rashes or lesions Lymph nodes: Cervical, supraclavicular, and axillary nodes normal. No abnormal inguinal nodes palpated Neurologic:  Grossly normal   Pelvic: External genitalia:  no lesions              Urethra:  normal appearing urethra with no masses, tenderness or lesions              Bartholins and Skenes: normal                 Vagina: normal appearing vagina with normal color and discharge, no lesions              Cervix: no lesions              Pap taken: Yes.   Bimanual Exam:  Uterus:  normal size, contour, position, consistency, mobility, non-tender              Adnexa: normal adnexa and no mass, fullness, tenderness               Rectovaginal: Confirms               Anus:  normal sphincter tone, no lesions  Chaperone was present for exam.  A:  Well Woman with normal  exam H/O PMP bleeding, resolved off HRT H/O 3 cesarean sections H/O dizziness of unknow etiology with neurologic and cardiac evaluations that were negative.  H/O elevated LDLs  P: Mammogram yearly. Pt aware due. Will schedule for pt. pap smear with HR HPV 2014. Pap today. Labs with Dr. Laurann Montana next week.  We discussed pt doing Hep C testing as well. On Celexa 40mg  daily.  #90/4RF  return annually or prn

## 2015-09-15 ENCOUNTER — Telehealth: Payer: Self-pay | Admitting: *Deleted

## 2015-09-15 NOTE — Telephone Encounter (Signed)
Called patient and notified. Agreed with day and time.

## 2015-09-15 NOTE — Telephone Encounter (Signed)
Patient returned call

## 2015-09-15 NOTE — Telephone Encounter (Signed)
Called pt. Left voicemail with MMG appt info. Friday March 10th arrive by 4:20pm at [place].

## 2015-09-16 LAB — IPS PAP TEST WITH REFLEX TO HPV

## 2015-10-03 ENCOUNTER — Inpatient Hospital Stay: Admission: RE | Admit: 2015-10-03 | Payer: Self-pay | Source: Ambulatory Visit

## 2015-10-10 ENCOUNTER — Other Ambulatory Visit: Payer: Self-pay | Admitting: Obstetrics & Gynecology

## 2015-10-13 ENCOUNTER — Other Ambulatory Visit: Payer: Self-pay | Admitting: Obstetrics & Gynecology

## 2015-10-13 NOTE — Telephone Encounter (Signed)
09/12/2015 #90/4 rfs sent to [facility] denied.

## 2015-10-13 NOTE — Telephone Encounter (Signed)
Patient calling for a refill on Citalopram to be sent to [facility] on [address] at [phone].

## 2015-10-13 NOTE — Telephone Encounter (Signed)
09/12/15 #90/4 rfs was sent to [facility], called [facility] and spoke with Merrilee Seashore who confirmed they do have refills for patient x 1 year. Patient notified and aware.

## 2015-11-19 ENCOUNTER — Ambulatory Visit
Admission: RE | Admit: 2015-11-19 | Discharge: 2015-11-19 | Disposition: A | Discharge status: Home / Self Care | Payer: BLUE CROSS/BLUE SHIELD | Source: Ambulatory Visit | Attending: Obstetrics & Gynecology | Admitting: Obstetrics & Gynecology

## 2015-11-19 DIAGNOSIS — Z1239 Encounter for other screening for malignant neoplasm of breast: Secondary | ICD-10-CM

## 2015-11-19 DIAGNOSIS — Z1231 Encounter for screening mammogram for malignant neoplasm of breast: Secondary | ICD-10-CM | POA: Diagnosis not present

## 2015-11-20 DIAGNOSIS — H1030 Unspecified acute conjunctivitis, unspecified eye: Secondary | ICD-10-CM | POA: Diagnosis not present

## 2016-02-12 DIAGNOSIS — Z961 Presence of intraocular lens: Secondary | ICD-10-CM | POA: Diagnosis not present

## 2016-02-12 DIAGNOSIS — H26491 Other secondary cataract, right eye: Secondary | ICD-10-CM | POA: Diagnosis not present

## 2016-02-12 DIAGNOSIS — H25012 Cortical age-related cataract, left eye: Secondary | ICD-10-CM | POA: Diagnosis not present

## 2016-02-12 DIAGNOSIS — H2512 Age-related nuclear cataract, left eye: Secondary | ICD-10-CM | POA: Diagnosis not present

## 2016-10-01 DIAGNOSIS — R42 Dizziness and giddiness: Secondary | ICD-10-CM | POA: Diagnosis not present

## 2016-10-01 DIAGNOSIS — Z0000 Encounter for general adult medical examination without abnormal findings: Secondary | ICD-10-CM | POA: Diagnosis not present

## 2016-10-01 DIAGNOSIS — Z23 Encounter for immunization: Secondary | ICD-10-CM | POA: Diagnosis not present

## 2016-10-01 DIAGNOSIS — N951 Menopausal and female climacteric states: Secondary | ICD-10-CM | POA: Diagnosis not present

## 2016-10-01 DIAGNOSIS — G2581 Restless legs syndrome: Secondary | ICD-10-CM | POA: Diagnosis not present

## 2016-11-18 ENCOUNTER — Encounter: Payer: Self-pay | Admitting: Obstetrics & Gynecology

## 2016-11-18 ENCOUNTER — Ambulatory Visit (INDEPENDENT_AMBULATORY_CARE_PROVIDER_SITE_OTHER): Payer: BLUE CROSS/BLUE SHIELD | Admitting: Obstetrics & Gynecology

## 2016-11-18 ENCOUNTER — Other Ambulatory Visit (HOSPITAL_COMMUNITY)
Admission: RE | Admit: 2016-11-18 | Discharge: 2016-11-18 | Disposition: A | Discharge status: Home / Self Care | Payer: BLUE CROSS/BLUE SHIELD | Source: Ambulatory Visit | Attending: Obstetrics & Gynecology | Admitting: Obstetrics & Gynecology

## 2016-11-18 ENCOUNTER — Other Ambulatory Visit: Payer: Self-pay | Admitting: Obstetrics & Gynecology

## 2016-11-18 VITALS — BP 122/88 | HR 74 | Resp 16 | Ht 64.0 in | Wt 113.0 lb

## 2016-11-18 DIAGNOSIS — Z124 Encounter for screening for malignant neoplasm of cervix: Secondary | ICD-10-CM | POA: Insufficient documentation

## 2016-11-18 DIAGNOSIS — Z1231 Encounter for screening mammogram for malignant neoplasm of breast: Secondary | ICD-10-CM

## 2016-11-18 DIAGNOSIS — Z01419 Encounter for gynecological examination (general) (routine) without abnormal findings: Secondary | ICD-10-CM | POA: Diagnosis not present

## 2016-11-18 MED ORDER — CITALOPRAM HYDROBROMIDE 40 MG PO TABS: 40.0000 mg | ORAL_TABLET | Freq: Every day | ORAL | 4 refills | Status: DC

## 2016-11-18 NOTE — Progress Notes (Signed)
61 y.o. G4P3 MarriedCaucasianF here for annual exam.  Doing well.  Out of her citalopram.  Feeling more emotional the last week.  Would like to restart it.  Denies vaginal bleeding.    Patient's last menstrual period was 05/26/2012.          Sexually active: Yes.    The current method of family planning is post menopausal status.    Exercising: Yes.    rus, tennis, weights Smoker:  no  Health Maintenance: Pap:  09/12/15 Neg   06/20/13 Neg. HR HPV:neg  History of abnormal Pap:  no MMG:  11/19/15 BIRADS1:neg.  Colonoscopy:  04/17/12 Polyps f/u 5 years BMD:   Never TDaP:  2016  Pneumonia vaccine(s):  No Zostavax:   No Hep C testing: Declines Screening Labs: PCP   reports that she has never smoked. She has never used smokeless tobacco. She reports that she drinks about 7.2 - 8.4 oz of alcohol per week . She reports that she does not use drugs.  Past Medical History:  Diagnosis Date  . Abnormal brain MRI    off HRT  . Anemia   . Migraine   . PFO (patent foramen ovale)    Dr. Jeani Hawking Ward    Past Surgical History:  Procedure Laterality Date  . CERVICAL FUSION     C5/6  . CESAREAN SECTION     x 3  . HYSTEROSCOPY W/D&C  10/18/2011   Procedure: DILATATION AND CURETTAGE /HYSTEROSCOPY;  Surgeon: Megan Salon, MD;  Location: [facility];  Service: Gynecology;  Laterality: N/A;    Current Outpatient Prescriptions  Medication Sig Dispense Refill  . Amino Acids (AMINO ACID PO) Take by mouth. Gabatone K-39 daily    . citalopram (CELEXA) 40 MG tablet Take 1 tablet (40 mg total) by mouth daily. 90 tablet 4  . diazepam (VALIUM) 5 MG tablet 1/2 tablet as needed. 30 tablet 0  . Multiple Vitamin (MULTIVITAMIN) tablet Take 1 tablet by mouth daily.    . Probiotic Product (PROBIOTIC DAILY PO) Take by mouth daily.     No current facility-administered medications for this visit.     Family History  Problem Relation Age of Onset  . Heart disease Mother   . Diabetes Mother   . Hypertension Mother    . Heart disease Father   . Heart disease Brother   . Diabetes Maternal Grandmother     ROS:  Pertinent items are noted in HPI.  Otherwise, a comprehensive ROS was negative.  Exam:   BP 122/88 (BP Location: Right Arm, Patient Position: Sitting, Cuff Size: Normal)   Pulse 74   Resp 16   Ht 5\' 4"  (1.626 m)   Wt 113 lb (51.3 kg)   LMP 05/26/2012   BMI 19.40 kg/m   Weight change: -1#    Height: 5\' 4"  (162.6 cm)  Ht Readings from Last 3 Encounters:  11/18/16 5\' 4"  (1.626 m)  09/12/15 5' 4.25" (1.632 m)  07/12/14 5' 4.5" (1.638 m)    General appearance: alert, cooperative and appears stated age Head: Normocephalic, without obvious abnormality, atraumatic Neck: no adenopathy, supple, symmetrical, trachea midline and thyroid normal to inspection and palpation Lungs: clear to auscultation bilaterally Breasts: normal appearance, no masses or tenderness Heart: regular rate and rhythm Abdomen: soft, non-tender; bowel sounds normal; no masses,  no organomegaly Extremities: extremities normal, atraumatic, no cyanosis or edema Skin: Skin color, texture, turgor normal. No rashes or lesions Lymph nodes: Cervical, supraclavicular, and axillary nodes normal. No abnormal  inguinal nodes palpated Neurologic: Grossly normal  Pelvic: External genitalia:  no lesions              Urethra:  normal appearing urethra with no masses, tenderness or lesions              Bartholins and Skenes: normal                 Vagina: normal appearing vagina with normal color and discharge, no lesions              Cervix: no lesions              Pap taken: Yes.   Bimanual Exam:  Uterus:  normal size, contour, position, consistency, mobility, non-tender              Adnexa: normal adnexa and no mass, fullness, tenderness               Rectovaginal: Confirms               Anus:  normal sphincter tone, no lesions  Chaperone was present for exam.  A:  Well Woman with normal exam PMP, no HRT H/O 3 cesarean  section H/O elevated LDLs  P:   Mammogram guidelines reviewed. pap smear and HR HPV obtained today Lab work and vaccines are UTD.  Declines Hep C testing today.  She's not sure if DR. Laurann Montana has done this. Celexa 40mg  daily.  #90/4RF. Annually or prn

## 2016-11-19 LAB — CYTOLOGY - PAP
Diagnosis: NEGATIVE
HPV: NOT DETECTED

## 2016-11-22 DIAGNOSIS — L821 Other seborrheic keratosis: Secondary | ICD-10-CM | POA: Diagnosis not present

## 2016-12-01 ENCOUNTER — Ambulatory Visit
Admission: RE | Admit: 2016-12-01 | Discharge: 2016-12-01 | Disposition: A | Discharge status: Home / Self Care | Payer: BLUE CROSS/BLUE SHIELD | Source: Ambulatory Visit | Attending: Obstetrics & Gynecology | Admitting: Obstetrics & Gynecology

## 2016-12-01 DIAGNOSIS — Z1231 Encounter for screening mammogram for malignant neoplasm of breast: Secondary | ICD-10-CM

## 2016-12-24 ENCOUNTER — Ambulatory Visit: Payer: BLUE CROSS/BLUE SHIELD | Admitting: Obstetrics & Gynecology

## 2017-02-23 DIAGNOSIS — R109 Unspecified abdominal pain: Secondary | ICD-10-CM | POA: Diagnosis not present

## 2017-03-18 ENCOUNTER — Ambulatory Visit (INDEPENDENT_AMBULATORY_CARE_PROVIDER_SITE_OTHER): Payer: BLUE CROSS/BLUE SHIELD | Admitting: Sports Medicine

## 2017-03-18 DIAGNOSIS — M791 Myalgia: Secondary | ICD-10-CM | POA: Diagnosis not present

## 2017-03-18 DIAGNOSIS — M7918 Myalgia, other site: Secondary | ICD-10-CM

## 2017-03-18 NOTE — Assessment & Plan Note (Addendum)
Patient is here with complaints consistent with paralumbar muscle soreness. There is a strong possibility that patient's new footwear could be direct cause for this issue. It was noted that patient's right SI joint was also tight. Patient had no symptoms on this side however. Patient's gait was also notable with pronation of feet bilaterally (R>L) while jogging. - Rest, ice, NSAIDs as needed - Patient to follow-up for orthotics

## 2017-03-18 NOTE — Progress Notes (Signed)
   HPI  CC: Low back pain Patient is a new patient to our clinic who is presenting today with low back pain over the past 1 month. She states that her back pain feels "stiff". Pain does not radiate. Pain is localized over the paraspinal musculature of her lumbar spine. She believes that her pain began shortly after transitioning to a new set of foot where. Pain is worse when she is immobile. She seems to have some improvement with increased activity, however prolonged activity will make this discomfort worse. She denies any radiation of the pain. No weakness, numbness, paresthesias. No fever, chills, or bowel/bladder incontinence.  Traumatic: No  Location: Lumbar spine  Quality: Dull, stiff  Duration: One month  Timing: Prolonged sitting  Improving/Worsening: Stable  Makes better: Activity, short-term  Makes worse: Prolonged sitting  Associated symptoms: None   Previous Interventions Tried: Stretching   Past Injuries: None  Past Surgeries: Cervical spinal fusion  Smoking: No  Family Hx: Noncontributory   ROS: Per HPI; in addition no fever, no rash, no additional weakness, no additional numbness, no additional paresthesias, and no additional falls/injury.   Objective: BP 122/82   Ht 5' 4.5" (1.638 m)   Wt 116 lb (52.6 kg)   LMP 05/26/2012   BMI 19.60 kg/m  Gen: NAD, well groomed, a/o x3, normal affect.  CV: Well-perfused. Warm.  Resp: Non-labored.  Neuro: Sensation intact throughout. No gross coordination deficits.  Gait: Nonpathologic posture, unremarkable stride without signs of limp or balance issues. Pronation during jogging gait bilaterally. Hips/Back: TTP noted at L4-5 paraspinal musculature. No obvious rash, erythema, ecchymosis, or edema. ROM of back for an all directions. ROM of hip full in all directions (IR: 80/ ER: 80/Flex: 120/Ext: 100/Abd: 45/Add: 45); Strength 5/5 in IR/ER/Flex/Ext/Abd/Add. Pelvic alignment unremarkable to inspection and palpation. Standing  hip rotation and gait without trendelenburg / unsteadiness. Greater trochanter without tenderness to palpation. No tenderness over piriformis. No SI joint tenderness, however right-sided SI movement was reduced compared to left. FABER reduced on right side compared to left.   Assessment and Plan:  Pain of paraspinal muscle Patient is here with complaints consistent with paralumbar muscle soreness. There is a strong possibility that patient's new footwear could be direct cause for this issue. It was noted that patient's right SI joint was also tight. Patient had no symptoms on this side however. Patient's gait was also notable with pronation of feet bilaterally (R>L) while jogging. - Rest, ice, NSAIDs as needed - Patient to follow-up for orthotics   Elberta Leatherwood, MD,MS [facility] 03/18/2017 4:53 PM

## 2017-04-04 ENCOUNTER — Encounter: Payer: BLUE CROSS/BLUE SHIELD | Admitting: Sports Medicine

## 2017-04-11 ENCOUNTER — Ambulatory Visit (INDEPENDENT_AMBULATORY_CARE_PROVIDER_SITE_OTHER): Payer: BLUE CROSS/BLUE SHIELD | Admitting: Sports Medicine

## 2017-04-11 ENCOUNTER — Ambulatory Visit
Admission: RE | Admit: 2017-04-11 | Discharge: 2017-04-11 | Disposition: A | Discharge status: Home / Self Care | Payer: BLUE CROSS/BLUE SHIELD | Source: Ambulatory Visit | Attending: Sports Medicine | Admitting: Sports Medicine

## 2017-04-11 VITALS — BP 112/82 | Ht 64.0 in | Wt 115.0 lb

## 2017-04-11 DIAGNOSIS — M545 Low back pain, unspecified: Secondary | ICD-10-CM

## 2017-04-11 NOTE — Progress Notes (Addendum)
Subjective:    Patient ID: Angela Snyder, female    DOB: 1955/11/16, 61 y.o.   MRN: [id number]  Patient is a 61 year old female with a past medical history significant for degenerative disc disease in her cervical spine who presents today with the chief complaint of acute low back pain. She rates her pain at moderate in nature. Her pain began after playing in our half worth of indoor tennis on 04/09/2017. She did have to stop participating in the event secondary to pain. She has had symptoms like this approximately one month ago when she was seen at the sports medicine clinic. She states that her symptoms at that time had completely resolved prior to irritating it a can while playing tennis. She is able to put on her shoes without a significant reproduction of symptoms as well as is able to get up out of bed without difficulty. Prolonged sitting does not exacerbate her symptoms. She is right-handed. She states that activity modification and Advil did help with her symptoms. Extension exercises of her back exacerbate her symptoms. She denies any radicular symptoms in nature. Denies a numbness or tingling. Denies any loss of bowel or bladder function. Denies any pain in the hip or at the knee. Denies any dysuria or nausea.      Review of Systems  Constitutional: Negative for unexpected weight change.  Gastrointestinal: Negative for nausea and vomiting.  Musculoskeletal: Positive for back pain and neck stiffness. Negative for arthralgias, gait problem, joint swelling, myalgias and neck pain.  Skin: Negative for rash.  Neurological: Negative for weakness and numbness.  Psychiatric/Behavioral: Negative for confusion.       Objective:   Physical Exam  Constitutional: She appears well-developed and well-nourished. No distress.  Musculoskeletal:  Upon inspection, the patient is sitting comfortably with an upright posture. She has no obvious abnormalities of the cervical, thoracic, or lumbar spine.  No changes in the over lying skin. She has no significant tenderness to palpation midline of her cervical thoracic or lumbar spine. She has no paraspinal bogginess or tenderness. Upon range of motion testing, she does have mild stiffness/discomfort upon extension of her lumbar spine. No pain with forward flexion or asymmetry. No pain with rotational movements of her lumbar or thoracic spine. Strength testing is +5 out of 5 in forward flexion and +5 out of 5 in extension. Negative Spurling's test. Negative leg raising bilaterally. Negative Trendelenburg. Sensation is intact to light touch L4-S1. Reflexes L4: +2 out of 4, S1: +2 out of 4.  No obvious abnormalities upon inspection of the pelvis and bilateral lower extremities. No tenderness to palpation over the greater trochanters. Patient has full range of motion and active and passive modalities and hip flexion and extension. +5 out of 5 in hip flexion bilaterally, +5 out of 5 and hip extension. +5 out of 5 in hip abduction and adduction. No saddle anesthesia. Negative logroll test bilaterally. Negative cross leg tests bilaterally. No significant pain on [facility] testing. Distal pulses are intact dorsalis pedis +2 out of 4.  Neurological: She displays normal reflexes. She exhibits normal muscle tone. Coordination normal.  Gait is examined and she has mild pronation of her left foot compared to right foot.  Skin: Skin is warm. No rash noted.          Assessment & Plan:  Acute low back pain, rule out facet arthropathy Hyperpronation of left foot  Given the patient's history and physical exam, I am recommending further diagnostic testing including an x-ray  of her lumbar spine AP and lateral views to rule out facet arthropathy. Her injury does appear to be acute in nature however it is recurrent back pain. Given that she is more painful and extension, I am concerned for possible arthritis in her back. I'm recommending that she perform flexion exercises to  rehabilitate her low back at home. Furthermore, I'm recommending that she decrease her rotational/twisting activities until her back is feeling better. This means decreasing hard surface tennis. She may continue to wear her over-the-counter orthotics and I am recommending soft support in her shoes. If she continues to have pain despite these treatments, I'm recommending evaluation in 4 weeks or as needed. Patient expresses understanding of this current plan. She is demonstrated home exercises by the athletic training staff (Williams flexion exercises). She was also given patient education on warning signs to look out for the neck are present for reevaluation. Call with results of x-rays when available and follow up PRN.  Addendum: X-rays reviewed. They're fairly unremarkable. Patient will be notified of the results.

## 2017-04-11 NOTE — Patient Instructions (Addendum)
It was great to see you today for your office visit. I am recommending an xray of your low back for further evaluation. I will give you low back exercises to help stretch and strengthen your lower back. These will involve flexing at the waist and can be performed two times per day. If you are not improving, I am recommending reevaluation in 4 weeks and consideration of further therapy or imaging at that time.  Back Pain, Adult Back pain is very common in adults.The cause of back pain is rarely dangerous and the pain often gets better over time.The cause of your back pain may not be known. Some common causes of back pain include:  Strain of the muscles or ligaments supporting the spine.  Wear and tear (degeneration) of the spinal disks.  Arthritis.  Direct injury to the back.  For many people, back pain may return. Since back pain is rarely dangerous, most people can learn to manage this condition on their own. Follow these instructions at home: Watch your back pain for any changes. The following actions may help to lessen any discomfort you are feeling:  Remain active. It is stressful on your back to sit or stand in one place for long periods of time. Do not sit, drive, or stand in one place for more than 30 minutes at a time. Take short walks on even surfaces as soon as you are able.Try to increase the length of time you walk each day.  Exercise regularly as directed by your health care provider. Exercise helps your back heal faster. It also helps avoid future injury by keeping your muscles strong and flexible.  Do not stay in bed.Resting more than 1-2 days can delay your recovery.  Pay attention to your body when you bend and lift. The most comfortable positions are those that put less stress on your recovering back. Always use proper lifting techniques, including: ? Bending your knees. ? Keeping the load close to your body. ? Avoiding twisting.  Find a comfortable position to  sleep. Use a firm mattress and lie on your side with your knees slightly bent. If you lie on your back, put a pillow under your knees.  Avoid feeling anxious or stressed.Stress increases muscle tension and can worsen back pain.It is important to recognize when you are anxious or stressed and learn ways to manage it, such as with exercise.  Take medicines only as directed by your health care provider. Over-the-counter medicines to reduce pain and inflammation are often the most helpful.Your health care provider may prescribe muscle relaxant drugs.These medicines help dull your pain so you can more quickly return to your normal activities and healthy exercise.  Apply ice to the injured area: ? Put ice in a plastic bag. ? Place a towel between your skin and the bag. ? Leave the ice on for 20 minutes, 2-3 times a day for the first 2-3 days. After that, ice and heat may be alternated to reduce pain and spasms.  Maintain a healthy weight. Excess weight puts extra stress on your back and makes it difficult to maintain good posture.  Contact a health care provider if:  You have pain that is not relieved with rest or medicine.  You have increasing pain going down into the legs or buttocks.  You have pain that does not improve in one week.  You have night pain.  You lose weight.  You have a fever or chills. Get help right away if:  You develop  new bowel or bladder control problems.  You have unusual weakness or numbness in your arms or legs.  You develop nausea or vomiting.  You develop abdominal pain.  You feel faint. This information is not intended to replace advice given to you by your health care provider. Make sure you discuss any questions you have with your health care provider. Document Released: 07/12/2005 Document Revised: 11/20/2015 Document Reviewed: 11/13/2013 Elsevier Interactive Patient Education  2017 [employer].

## 2017-06-09 DIAGNOSIS — R1012 Left upper quadrant pain: Secondary | ICD-10-CM | POA: Diagnosis not present

## 2017-06-09 DIAGNOSIS — D126 Benign neoplasm of colon, unspecified: Secondary | ICD-10-CM | POA: Diagnosis not present

## 2017-06-30 ENCOUNTER — Encounter: Payer: Self-pay | Admitting: Internal Medicine

## 2017-07-29 DIAGNOSIS — R1012 Left upper quadrant pain: Secondary | ICD-10-CM | POA: Diagnosis not present

## 2017-07-29 DIAGNOSIS — K2950 Unspecified chronic gastritis without bleeding: Secondary | ICD-10-CM | POA: Diagnosis not present

## 2017-07-29 DIAGNOSIS — K259 Gastric ulcer, unspecified as acute or chronic, without hemorrhage or perforation: Secondary | ICD-10-CM | POA: Diagnosis not present

## 2017-07-29 DIAGNOSIS — D130 Benign neoplasm of esophagus: Secondary | ICD-10-CM | POA: Diagnosis not present

## 2017-07-29 DIAGNOSIS — Z86010 Personal history of colonic polyps: Secondary | ICD-10-CM | POA: Diagnosis not present

## 2017-07-29 DIAGNOSIS — K2980 Duodenitis without bleeding: Secondary | ICD-10-CM | POA: Diagnosis not present

## 2017-07-29 DIAGNOSIS — D126 Benign neoplasm of colon, unspecified: Secondary | ICD-10-CM | POA: Diagnosis not present

## 2017-07-29 DIAGNOSIS — K635 Polyp of colon: Secondary | ICD-10-CM | POA: Diagnosis not present

## 2017-07-29 DIAGNOSIS — K2930 Chronic superficial gastritis without bleeding: Secondary | ICD-10-CM | POA: Diagnosis not present

## 2017-08-03 DIAGNOSIS — D126 Benign neoplasm of colon, unspecified: Secondary | ICD-10-CM | POA: Diagnosis not present

## 2017-08-03 DIAGNOSIS — K2930 Chronic superficial gastritis without bleeding: Secondary | ICD-10-CM | POA: Diagnosis not present

## 2017-08-03 DIAGNOSIS — D130 Benign neoplasm of esophagus: Secondary | ICD-10-CM | POA: Diagnosis not present

## 2017-08-03 DIAGNOSIS — K2980 Duodenitis without bleeding: Secondary | ICD-10-CM | POA: Diagnosis not present

## 2017-08-22 DIAGNOSIS — L814 Other melanin hyperpigmentation: Secondary | ICD-10-CM | POA: Diagnosis not present

## 2017-08-22 DIAGNOSIS — D225 Melanocytic nevi of trunk: Secondary | ICD-10-CM | POA: Diagnosis not present

## 2017-08-22 DIAGNOSIS — D1801 Hemangioma of skin and subcutaneous tissue: Secondary | ICD-10-CM | POA: Diagnosis not present

## 2017-08-22 DIAGNOSIS — L821 Other seborrheic keratosis: Secondary | ICD-10-CM | POA: Diagnosis not present

## 2017-08-24 DIAGNOSIS — J069 Acute upper respiratory infection, unspecified: Secondary | ICD-10-CM | POA: Diagnosis not present

## 2017-10-10 DIAGNOSIS — J019 Acute sinusitis, unspecified: Secondary | ICD-10-CM | POA: Diagnosis not present

## 2017-10-25 DIAGNOSIS — Z131 Encounter for screening for diabetes mellitus: Secondary | ICD-10-CM | POA: Diagnosis not present

## 2017-10-25 DIAGNOSIS — Z0000 Encounter for general adult medical examination without abnormal findings: Secondary | ICD-10-CM | POA: Diagnosis not present

## 2017-10-25 DIAGNOSIS — Z136 Encounter for screening for cardiovascular disorders: Secondary | ICD-10-CM | POA: Diagnosis not present

## 2017-12-05 ENCOUNTER — Ambulatory Visit: Payer: BLUE CROSS/BLUE SHIELD | Admitting: Obstetrics & Gynecology

## 2017-12-08 ENCOUNTER — Ambulatory Visit (INDEPENDENT_AMBULATORY_CARE_PROVIDER_SITE_OTHER): Payer: BLUE CROSS/BLUE SHIELD | Admitting: Obstetrics & Gynecology

## 2017-12-08 ENCOUNTER — Other Ambulatory Visit: Payer: Self-pay

## 2017-12-08 ENCOUNTER — Encounter: Payer: Self-pay | Admitting: Obstetrics & Gynecology

## 2017-12-08 VITALS — BP 102/76 | HR 68 | Resp 14 | Ht 64.0 in | Wt 118.4 lb

## 2017-12-08 DIAGNOSIS — Z205 Contact with and (suspected) exposure to viral hepatitis: Secondary | ICD-10-CM | POA: Diagnosis not present

## 2017-12-08 DIAGNOSIS — Z01419 Encounter for gynecological examination (general) (routine) without abnormal findings: Secondary | ICD-10-CM | POA: Diagnosis not present

## 2017-12-08 DIAGNOSIS — E2839 Other primary ovarian failure: Secondary | ICD-10-CM

## 2017-12-08 MED ORDER — CITALOPRAM HYDROBROMIDE 40 MG PO TABS: 40.0000 mg | ORAL_TABLET | Freq: Every day | ORAL | 4 refills | Status: DC

## 2017-12-08 NOTE — Patient Instructions (Signed)
Please make sure to schedule your bone density [facility].

## 2017-12-08 NOTE — Progress Notes (Signed)
62 y.o. G4P3 MarriedCaucasianF here for annual exam.  Doing well.  Denies vaginal bleeding.  Had a colonoscopy and endoscopy this year.  Had an ulcer and this was treated.  Now on pro-biotics.  Feels this has helped.    Daughter here in [place] is expecting.  She is expecting a baby boy.    Patient's last menstrual period was 05/26/2012.          Sexually active: Yes.    The current method of family planning is post menopausal status.    Exercising: Yes.    running, tennis, weights Smoker:  no  Health Maintenance: Pap:  11/18/16 Neg. HR HPV:neg   09/12/15 Neg  History of abnormal Pap:  no MMG:  12/01/16 BIRADS1:Neg.  Aware this is due.   Colonoscopy: 07/2017.  Colonoscopy was negative.  Endoscopy showed an ulcer. F/u 5 years. BMD:   Never TDaP:  2016 Pneumonia vaccine(s):  n/a Shingrix:   No.  Not interested in this at this time. Hep C testing: Has not been done Screening Labs: PCP   reports that she has never smoked. She has never used smokeless tobacco. She reports that she drinks about 4.2 - 6.0 oz of alcohol per week. She reports that she does not use drugs.  Past Medical History:  Diagnosis Date  . Abnormal brain MRI    off HRT  . Anemia   . Migraine   . PFO (patent foramen ovale)    Dr. Jeani Hawking Ward    Past Surgical History:  Procedure Laterality Date  . CERVICAL FUSION     C5/6  . CESAREAN SECTION     x 3  . HYSTEROSCOPY W/D&C  10/18/2011   Procedure: DILATATION AND CURETTAGE /HYSTEROSCOPY;  Surgeon: Megan Salon, MD;  Location: [facility];  Service: Gynecology;  Laterality: N/A;    Current Outpatient Medications  Medication Sig Dispense Refill  . Amino Acids (AMINO ACID PO) Take by mouth. Gabatone K-39 daily    . citalopram (CELEXA) 40 MG tablet Take 1 tablet (40 mg total) by mouth daily. 90 tablet 4  . diazepam (VALIUM) 5 MG tablet 1/2 tablet as needed. 30 tablet 0  . Ferrous Sulfate (IRON) 325 (65 Fe) MG TABS Take by mouth daily.    . Probiotic Product (PROBIOTIC  DAILY PO) Take by mouth daily.     No current facility-administered medications for this visit.     Family History  Problem Relation Age of Onset  . Heart disease Mother   . Diabetes Mother   . Hypertension Mother   . Heart disease Father   . Heart disease Brother   . Diabetes Maternal Grandmother     Review of Systems  All other systems reviewed and are negative.   Exam:   BP 102/76 (BP Location: Right Arm, Patient Position: Sitting, Cuff Size: Normal)   Pulse 68   Resp 14   Ht 5\' 4"  (1.626 m)   Wt 118 lb 6.4 oz (53.7 kg)   LMP 05/26/2012   BMI 20.32 kg/m   Height: 5\' 4"  (162.6 cm)  Ht Readings from Last 3 Encounters:  12/08/17 5\' 4"  (1.626 m)  04/11/17 5\' 4"  (1.626 m)  03/18/17 5' 4.5" (1.638 m)    General appearance: alert, cooperative and appears stated age Head: Normocephalic, without obvious abnormality, atraumatic Neck: no adenopathy, supple, symmetrical, trachea midline and thyroid normal to inspection and palpation Lungs: clear to auscultation bilaterally Breasts: normal appearance, no masses or tenderness Heart: regular rate and  rhythm Abdomen: soft, non-tender; bowel sounds normal; no masses,  no organomegaly Extremities: extremities normal, atraumatic, no cyanosis or edema Skin: Skin color, texture, turgor normal. No rashes or lesions Lymph nodes: Cervical, supraclavicular, and axillary nodes normal. No abnormal inguinal nodes palpated Neurologic: Grossly normal   Pelvic: External genitalia:  no lesions              Urethra:  normal appearing urethra with no masses, tenderness or lesions              Bartholins and Skenes: normal                 Vagina: normal appearing vagina with normal color and discharge, no lesions              Cervix: no lesions              Pap taken: No. Bimanual Exam:  Uterus:  normal size, contour, position, consistency, mobility, non-tender              Adnexa: normal adnexa and no mass, fullness, tenderness                Rectovaginal: Confirms               Anus:  normal sphincter tone, no lesions  Chaperone was present for exam.  A:  Well Woman with normal exam PMP, no HRT H/o 3 cesarean sections H/O elevated LDLs  P:   Mammogram guidelines reviewed BMD order placed for pt to do with MMG pap smear with neg HR HPV obtained 2018.  Not indicated today. Hep C antibody obtained today Celexa 40mg  daily.  #90/4RF.  She is considering cutting these in 1/2. Return annually or prn

## 2017-12-09 LAB — HEPATITIS C ANTIBODY: Hep C Virus Ab: <0.1 s/co ratio (ref 0.0–0.9)

## 2017-12-23 DIAGNOSIS — H25012 Cortical age-related cataract, left eye: Secondary | ICD-10-CM | POA: Diagnosis not present

## 2017-12-23 DIAGNOSIS — H2512 Age-related nuclear cataract, left eye: Secondary | ICD-10-CM | POA: Diagnosis not present

## 2017-12-23 DIAGNOSIS — H26491 Other secondary cataract, right eye: Secondary | ICD-10-CM | POA: Diagnosis not present

## 2017-12-23 DIAGNOSIS — Z961 Presence of intraocular lens: Secondary | ICD-10-CM | POA: Diagnosis not present

## 2018-11-14 DIAGNOSIS — K529 Noninfective gastroenteritis and colitis, unspecified: Secondary | ICD-10-CM | POA: Diagnosis not present

## 2018-12-06 DIAGNOSIS — Z0000 Encounter for general adult medical examination without abnormal findings: Secondary | ICD-10-CM | POA: Diagnosis not present

## 2018-12-07 ENCOUNTER — Other Ambulatory Visit: Payer: Self-pay | Admitting: Family Medicine

## 2018-12-07 DIAGNOSIS — Z1231 Encounter for screening mammogram for malignant neoplasm of breast: Secondary | ICD-10-CM

## 2018-12-07 DIAGNOSIS — E2839 Other primary ovarian failure: Secondary | ICD-10-CM

## 2018-12-11 DIAGNOSIS — R42 Dizziness and giddiness: Secondary | ICD-10-CM | POA: Diagnosis not present

## 2018-12-11 DIAGNOSIS — Z13220 Encounter for screening for lipoid disorders: Secondary | ICD-10-CM | POA: Diagnosis not present

## 2019-01-17 ENCOUNTER — Other Ambulatory Visit: Payer: Self-pay | Admitting: Obstetrics & Gynecology

## 2019-01-17 NOTE — Telephone Encounter (Signed)
Medication refill request: Celexa Last AEX:  12-08-17 SM  Next AEX: 03-15-2019 Last MMG (if hormonal medication request): n/a Refill authorized: 12-08-17 #90, 4RF. Today, please advise.   Medication pended for #90, 0RF. Please refill if appropriate.

## 2019-02-06 DIAGNOSIS — H25012 Cortical age-related cataract, left eye: Secondary | ICD-10-CM | POA: Diagnosis not present

## 2019-02-06 DIAGNOSIS — Z961 Presence of intraocular lens: Secondary | ICD-10-CM | POA: Diagnosis not present

## 2019-02-06 DIAGNOSIS — H2512 Age-related nuclear cataract, left eye: Secondary | ICD-10-CM | POA: Diagnosis not present

## 2019-02-06 DIAGNOSIS — H35371 Puckering of macula, right eye: Secondary | ICD-10-CM | POA: Diagnosis not present

## 2019-03-01 ENCOUNTER — Ambulatory Visit
Admission: RE | Admit: 2019-03-01 | Discharge: 2019-03-01 | Disposition: A | Discharge status: Home / Self Care | Payer: BC Managed Care – PPO | Source: Ambulatory Visit | Attending: Family Medicine | Admitting: Family Medicine

## 2019-03-01 ENCOUNTER — Other Ambulatory Visit: Payer: Self-pay

## 2019-03-01 DIAGNOSIS — E2839 Other primary ovarian failure: Secondary | ICD-10-CM

## 2019-03-01 DIAGNOSIS — Z1231 Encounter for screening mammogram for malignant neoplasm of breast: Secondary | ICD-10-CM | POA: Diagnosis not present

## 2019-03-01 DIAGNOSIS — Z780 Asymptomatic menopausal state: Secondary | ICD-10-CM | POA: Diagnosis not present

## 2019-03-01 DIAGNOSIS — M85851 Other specified disorders of bone density and structure, right thigh: Secondary | ICD-10-CM | POA: Diagnosis not present

## 2019-03-15 ENCOUNTER — Ambulatory Visit: Payer: BLUE CROSS/BLUE SHIELD | Admitting: Obstetrics & Gynecology

## 2019-03-19 DIAGNOSIS — H25012 Cortical age-related cataract, left eye: Secondary | ICD-10-CM | POA: Diagnosis not present

## 2019-03-19 DIAGNOSIS — H2512 Age-related nuclear cataract, left eye: Secondary | ICD-10-CM | POA: Diagnosis not present

## 2019-04-21 ENCOUNTER — Other Ambulatory Visit: Payer: Self-pay | Admitting: Obstetrics & Gynecology

## 2019-04-23 NOTE — Telephone Encounter (Signed)
Medication refill request: Celexa Last AEX:  12/08/2017 SM Next AEX: 06/15/2019 Last MMG (if hormonal medication request): 03/01/2019 BIRADS 1 Negative Density C Refill authorized: Pending #90 with no refills if appropriate. Please advise.

## 2019-06-15 ENCOUNTER — Ambulatory Visit: Payer: BC Managed Care – PPO | Admitting: Obstetrics & Gynecology

## 2019-07-12 ENCOUNTER — Other Ambulatory Visit: Payer: Self-pay

## 2019-07-13 ENCOUNTER — Ambulatory Visit: Payer: BC Managed Care – PPO | Admitting: Obstetrics & Gynecology

## 2019-07-13 ENCOUNTER — Encounter: Payer: Self-pay | Admitting: Obstetrics & Gynecology

## 2019-07-13 NOTE — Progress Notes (Deleted)
63 y.o. G4P3 Married White or Caucasian female here for annual exam.    Patient's last menstrual period was 05/26/2012.          Sexually active: {yes no:314532}  The current method of family planning is post menopausal status.    Exercising: {yes no:314532}  {types:19826} Smoker:  no  Health Maintenance: Pap:  11/18/16 Neg. HR HPV:neg              09/12/15 Neg  History of abnormal Pap:  no MMG:  03/01/19 BIRADS 1 negative/density c Colonoscopy:  04/17/12 f/u 10 years BMD:   03/01/19 Osteopenia TDaP:  2016 Pneumonia vaccine(s):  n/a Shingrix:   *** Hep C testing: 12/08/17 Neg Screening Labs: ***   reports that she has never smoked. She has never used smokeless tobacco. She reports current alcohol use of about 7.0 - 10.0 standard drinks of alcohol per week. She reports that she does not use drugs.  Past Medical History:  Diagnosis Date  . Abnormal brain MRI    off HRT  . Anemia   . Migraine   . PFO (patent foramen ovale)    Dr. Jeani Hawking Ward    Past Surgical History:  Procedure Laterality Date  . CERVICAL FUSION     C5/6  . CESAREAN SECTION     x 3  . HYSTEROSCOPY WITH D & C  10/18/2011   Procedure: DILATATION AND CURETTAGE /HYSTEROSCOPY;  Surgeon: Megan Salon, MD;  Location: [facility];  Service: Gynecology;  Laterality: N/A;    Current Outpatient Medications  Medication Sig Dispense Refill  . Amino Acids (AMINO ACID PO) Take by mouth. Gabatone K-39 daily    . citalopram (CELEXA) 40 MG tablet TAKE 1 TABLET(40 MG) BY MOUTH DAILY 90 tablet 0  . diazepam (VALIUM) 5 MG tablet 1/2 tablet as needed. 30 tablet 0  . Ferrous Sulfate (IRON) 325 (65 Fe) MG TABS Take by mouth daily.    . Probiotic Product (PROBIOTIC DAILY PO) Take by mouth daily.     No current facility-administered medications for this visit.    Family History  Problem Relation Age of Onset  . Heart disease Mother   . Diabetes Mother   . Hypertension Mother   . Heart disease Father   . Heart disease Brother   .  Diabetes Maternal Grandmother     Review of Systems  Exam:   LMP 05/26/2012   Height:      Ht Readings from Last 3 Encounters:  12/08/17 5\' 4"  (1.626 m)  04/11/17 5\' 4"  (1.626 m)  03/18/17 5' 4.5" (1.638 m)    General appearance: alert, cooperative and appears stated age Head: Normocephalic, without obvious abnormality, atraumatic Neck: no adenopathy, supple, symmetrical, trachea midline and thyroid {EXAM; THYROID:18604} Lungs: clear to auscultation bilaterally Breasts: {Exam; breast:13139::"normal appearance, no masses or tenderness"} Heart: regular rate and rhythm Abdomen: soft, non-tender; bowel sounds normal; no masses,  no organomegaly Extremities: extremities normal, atraumatic, no cyanosis or edema Skin: Skin color, texture, turgor normal. No rashes or lesions Lymph nodes: Cervical, supraclavicular, and axillary nodes normal. No abnormal inguinal nodes palpated Neurologic: Grossly normal   Pelvic: External genitalia:  no lesions              Urethra:  normal appearing urethra with no masses, tenderness or lesions              Bartholins and Skenes: normal  Vagina: normal appearing vagina with normal color and discharge, no lesions              Cervix: {exam; cervix:14595}              Pap taken: {yes no:314532} Bimanual Exam:  Uterus:  {exam; uterus:12215}              Adnexa: {exam; adnexa:12223}               Rectovaginal: Confirms               Anus:  normal sphincter tone, no lesions  Chaperone was present for exam.  A:  Well Woman with normal exam  P:   {plan; gyn:5269::"mammogram","pap smear","return annually or prn"}

## 2019-09-03 DIAGNOSIS — L578 Other skin changes due to chronic exposure to nonionizing radiation: Secondary | ICD-10-CM | POA: Diagnosis not present

## 2019-09-03 DIAGNOSIS — D225 Melanocytic nevi of trunk: Secondary | ICD-10-CM | POA: Diagnosis not present

## 2019-09-03 DIAGNOSIS — L821 Other seborrheic keratosis: Secondary | ICD-10-CM | POA: Diagnosis not present

## 2019-09-03 DIAGNOSIS — Z23 Encounter for immunization: Secondary | ICD-10-CM | POA: Diagnosis not present

## 2019-09-03 DIAGNOSIS — L814 Other melanin hyperpigmentation: Secondary | ICD-10-CM | POA: Diagnosis not present

## 2019-09-06 DIAGNOSIS — R109 Unspecified abdominal pain: Secondary | ICD-10-CM | POA: Diagnosis not present

## 2019-12-07 DIAGNOSIS — Z13220 Encounter for screening for lipoid disorders: Secondary | ICD-10-CM | POA: Diagnosis not present

## 2019-12-07 DIAGNOSIS — Z0000 Encounter for general adult medical examination without abnormal findings: Secondary | ICD-10-CM | POA: Diagnosis not present

## 2019-12-07 DIAGNOSIS — Z131 Encounter for screening for diabetes mellitus: Secondary | ICD-10-CM | POA: Diagnosis not present

## 2019-12-10 DIAGNOSIS — H5231 Anisometropia: Secondary | ICD-10-CM | POA: Diagnosis not present

## 2019-12-10 DIAGNOSIS — H35371 Puckering of macula, right eye: Secondary | ICD-10-CM | POA: Diagnosis not present

## 2019-12-10 DIAGNOSIS — H25012 Cortical age-related cataract, left eye: Secondary | ICD-10-CM | POA: Diagnosis not present

## 2019-12-10 DIAGNOSIS — H2512 Age-related nuclear cataract, left eye: Secondary | ICD-10-CM | POA: Diagnosis not present

## 2019-12-10 DIAGNOSIS — H524 Presbyopia: Secondary | ICD-10-CM | POA: Diagnosis not present

## 2019-12-13 ENCOUNTER — Other Ambulatory Visit: Payer: Self-pay | Admitting: Obstetrics & Gynecology

## 2019-12-13 ENCOUNTER — Other Ambulatory Visit: Payer: Self-pay

## 2019-12-13 NOTE — Telephone Encounter (Signed)
Spoke with pt. Pt requesting refill on Celexa. Pt was advised to make AEX appt. Pt had last AEX 11/2017 Pharmacy verified.  Advised will review with Dr Sabra Heck.  Next AEX scheduled for 06/13/20. Pt states takes Celexa as needed, not daily. Currently out of Rx. Was last refilled on 04/23/2019.  Rx for Celexa pended if approved. #90, 0RF. Routing to Dr Sabra Heck.

## 2019-12-13 NOTE — Telephone Encounter (Signed)
Patient is calling in regards to a refill of Citalopram. Patient states she spoke with pharmacy and they said our office denied the refill.

## 2019-12-14 MED ORDER — CITALOPRAM HYDROBROMIDE 40 MG PO TABS: ORAL_TABLET | ORAL | 0 refills | Status: AC

## 2020-06-13 ENCOUNTER — Ambulatory Visit: Payer: Self-pay | Admitting: Obstetrics & Gynecology

## 2020-08-27 DIAGNOSIS — R0789 Other chest pain: Secondary | ICD-10-CM | POA: Diagnosis not present

## 2020-08-28 DIAGNOSIS — M62830 Muscle spasm of back: Secondary | ICD-10-CM | POA: Diagnosis not present

## 2020-08-28 DIAGNOSIS — M9902 Segmental and somatic dysfunction of thoracic region: Secondary | ICD-10-CM | POA: Diagnosis not present

## 2020-08-28 DIAGNOSIS — M9903 Segmental and somatic dysfunction of lumbar region: Secondary | ICD-10-CM | POA: Diagnosis not present

## 2020-11-08 DIAGNOSIS — R0981 Nasal congestion: Secondary | ICD-10-CM | POA: Diagnosis not present

## 2020-11-08 DIAGNOSIS — R0982 Postnasal drip: Secondary | ICD-10-CM | POA: Diagnosis not present

## 2020-11-08 DIAGNOSIS — R5383 Other fatigue: Secondary | ICD-10-CM | POA: Diagnosis not present

## 2020-11-08 DIAGNOSIS — J019 Acute sinusitis, unspecified: Secondary | ICD-10-CM | POA: Diagnosis not present

## 2020-11-29 ENCOUNTER — Telehealth (HOSPITAL_COMMUNITY): Payer: Self-pay

## 2020-11-29 ENCOUNTER — Other Ambulatory Visit: Payer: Self-pay

## 2020-11-29 ENCOUNTER — Ambulatory Visit (HOSPITAL_COMMUNITY)
Admission: EM | Admit: 2020-11-29 | Discharge: 2020-11-29 | Disposition: A | Discharge status: Home / Self Care | Payer: BC Managed Care – PPO

## 2020-11-29 ENCOUNTER — Encounter (HOSPITAL_COMMUNITY): Payer: Self-pay | Admitting: Emergency Medicine

## 2020-11-29 DIAGNOSIS — B001 Herpesviral vesicular dermatitis: Secondary | ICD-10-CM

## 2020-11-29 DIAGNOSIS — L710 Perioral dermatitis: Secondary | ICD-10-CM

## 2020-11-29 MED ORDER — METRONIDAZOLE 0.75 % EX CREA: TOPICAL_CREAM | Freq: Two times a day (BID) | CUTANEOUS | 0 refills | Status: DC

## 2020-11-29 MED ORDER — PREDNISONE 20 MG PO TABS: 40.0000 mg | ORAL_TABLET | Freq: Every day | ORAL | 0 refills | Status: AC

## 2020-11-29 MED ORDER — VALACYCLOVIR HCL 1 G PO TABS: 1000.0000 mg | ORAL_TABLET | Freq: Three times a day (TID) | ORAL | 0 refills | Status: AC

## 2020-11-29 MED ORDER — VALACYCLOVIR HCL 1 G PO TABS: 1000.0000 mg | ORAL_TABLET | Freq: Three times a day (TID) | ORAL | 0 refills | Status: DC

## 2020-11-29 MED ORDER — METRONIDAZOLE 0.75 % EX CREA
TOPICAL_CREAM | Freq: Two times a day (BID) | CUTANEOUS | 0 refills | Status: DC
Start: 2020-11-29 — End: 2021-07-07

## 2020-11-29 MED ORDER — PREDNISONE 20 MG PO TABS: 40.0000 mg | ORAL_TABLET | Freq: Every day | ORAL | 0 refills | Status: DC

## 2020-11-29 NOTE — ED Triage Notes (Signed)
Pt presents today with rash around mouth x 2 weeks. She reports that it began as a cold sore. Pt using Docosanol Cream 2% OTC with minimal relief.

## 2020-11-29 NOTE — ED Provider Notes (Signed)
[facility]    CSN: [id number] Arrival date & time: 11/29/20  1132      History   Chief Complaint Chief Complaint  Patient presents with  . Mouth Lesions    HPI Angela Snyder is a 65 y.o. female.   Patient presents today with 1 week history of blisters on her lips.  She does report being under increased stress as she recently was in charge of her wedding.  She reports lesions have significantly worsened in the past several days and she now has erythema and pain in perioral distribution.  She contacted her PCP who sent in Valtrex which initially provided some improvement.  She is also been applying topical medications without improvement of symptoms.  She denies any recent medication changes, antibiotic use, known allergies, changes to personal hygiene products.  Denies episodes of similar symptoms in the past.  Denies history of cold sores.  She denies any mouth ulcers, dysphagia, odynophagia, shortness of breath, additional rash.  Patient reports great discomfort as result of lesions that are interfering with her ability to sleep.     Past Medical History:  Diagnosis Date  . Abnormal brain MRI    off HRT  . Anemia   . Migraine   . PFO (patent foramen ovale)    Dr. Jeani Hawking Ward    Patient Active Problem List   Diagnosis Date Noted  . Pain of paraspinal muscle 03/18/2017  . Ostium secundum type atrial septal defect 09/05/2012  . PFO (patent foramen ovale) 09/05/2012  . Adjustment disorder with anxiety 08/28/2012  . Cluster headache syndrome 08/28/2012  . Degeneration of cervical intervertebral disc 08/28/2012  . Symptomatic menopausal or female climacteric states 08/28/2012  . Dizziness 08/28/2012  . DDD (degenerative disc disease), cervical 08/28/2012  . Head revolving around 08/28/2012  . Anemia 10/18/2011    Past Surgical History:  Procedure Laterality Date  . CERVICAL FUSION     C5/6  . CESAREAN SECTION     x 3  . HYSTEROSCOPY WITH D & C  10/18/2011    Procedure: DILATATION AND CURETTAGE /HYSTEROSCOPY;  Surgeon: Megan Salon, MD;  Location: [facility];  Service: Gynecology;  Laterality: N/A;    OB History    Gravida  4   Para  3   Term      Preterm      AB      Living  3     SAB      IAB      Ectopic      Multiple      Live Births               Home Medications    Prior to Admission medications   Medication Sig Start Date End Date Taking? Authorizing Provider  Amino Acids (AMINO ACID PO) Take by mouth. Gabatone K-39 daily   Yes [provider]  citalopram (CELEXA) 40 MG tablet TAKE 1 TABLET(40 MG) BY MOUTH DAILY 12/14/19  Yes Megan Salon, MD  diazepam (VALIUM) 5 MG tablet 1/2 tablet as needed. 11/20/13  Yes Megan Salon, MD  metroNIDAZOLE (METROCREAM) 0.75 % cream Apply topically 2 (two) times daily. 11/29/20  Yes Meade Hogeland K, PA-C  predniSONE (DELTASONE) 20 MG tablet Take 2 tablets (40 mg total) by mouth daily with breakfast for 4 days. 11/29/20 12/03/20 Yes Izan Miron, Derry Skill, PA-C  Probiotic Product (PROBIOTIC DAILY PO) Take by mouth daily.   Yes [provider]  valACYclovir (VALTREX) 1000 MG  tablet Take 1 tablet (1,000 mg total) by mouth 3 (three) times daily for 7 days. 11/29/20 12/06/20 Yes Kalisa Girtman K, PA-C  cefdinir (OMNICEF) 300 MG capsule Take 300 mg by mouth 2 (two) times daily. 11/10/20   [provider]  Ferrous Sulfate (IRON) 325 (65 Fe) MG TABS Take by mouth daily.    [provider]    Family History Family History  Problem Relation Age of Onset  . Heart disease Mother   . Diabetes Mother   . Hypertension Mother   . Heart disease Father   . Heart disease Brother   . Diabetes Maternal Grandmother     Social History Social History   Tobacco Use  . Smoking status: Never Smoker  . Smokeless tobacco: Never Used  Vaping Use  . Vaping Use: Never used  Substance Use Topics  . Alcohol use: Yes    Alcohol/week: 7.0 - 10.0 standard drinks    Types: 7 - 10  Standard drinks or equivalent per week  . Drug use: No     Allergies   Cephalosporins, Codeine, and Hyoscyamine   Review of Systems Review of Systems  Constitutional: Positive for fatigue. Negative for activity change, appetite change and fever.  HENT: Positive for mouth sores (lip). Negative for congestion, sinus pressure, sneezing and sore throat.   Respiratory: Negative for cough and shortness of breath.   Cardiovascular: Negative for chest pain.  Gastrointestinal: Negative for abdominal pain, diarrhea, nausea and vomiting.  Musculoskeletal: Negative for arthralgias and myalgias.  Skin: Positive for rash.  Neurological: Negative for dizziness, light-headedness and headaches.     Physical Exam Triage Vital Signs ED Triage Vitals  Enc Vitals Group     BP 11/29/20 1224 126/82     Pulse Rate 11/29/20 1224 62     Resp 11/29/20 1224 16     Temp 11/29/20 1224 98.5 F (36.9 C)     Temp Source 11/29/20 1224 Oral     SpO2 11/29/20 1224 100 %     Weight --      Height --      Head Circumference --      Peak Flow --      Pain Score 11/29/20 1219 3     Pain Loc --      Pain Edu? --      Excl. in [place]? --    No data found.  Updated Vital Signs BP 126/82 (BP Location: Right Arm)   Pulse 62   Temp 98.5 F (36.9 C) (Oral)   Resp 16   LMP 05/26/2012   SpO2 100%   Visual Acuity Right Eye Distance:   Left Eye Distance:   Bilateral Distance:    Right Eye Near:   Left Eye Near:    Bilateral Near:     Physical Exam Vitals reviewed.  Constitutional:      General: She is awake. She is not in acute distress.    Appearance: Normal appearance. She is not ill-appearing.     Comments: Very pleasant female appears stated age in no acute distress  HENT:     Head: Normocephalic and atraumatic.     Mouth/Throat:     Pharynx: Uvula midline. No oropharyngeal exudate or posterior oropharyngeal erythema.  Cardiovascular:     Rate and Rhythm: Normal rate and regular rhythm.      Heart sounds: No murmur heard.   Pulmonary:     Effort: Pulmonary effort is normal.     Breath sounds:  Normal breath sounds. No wheezing, rhonchi or rales.     Comments: Clear to auscultation Lymphadenopathy:     Head:     Right side of head: No submental, submandibular or tonsillar adenopathy.     Left side of head: No submental, submandibular or tonsillar adenopathy.     Cervical: No cervical adenopathy.  Skin:    Comments: Cracking and blistering of lips with surrounding maculopapular rash with significant erythema perioral distribution.  No vesicles, bleeding, drainage noted.  Psychiatric:        Behavior: Behavior is cooperative.      UC Treatments / Results  Labs (all labs ordered are listed, but only abnormal results are displayed) Labs Reviewed - No data to display  EKG   Radiology No results found.  Procedures Procedures (including critical care time)  Medications Ordered in UC Medications - No data to display  Initial Impression / Assessment and Plan / UC Course  I have reviewed the triage vital signs and the nursing notes.  Pertinent labs & imaging results that were available during my care of the patient were reviewed by me and considered in my medical decision making (see chart for details).     Concern for severe perioral dermatitis.  We will start metronidazole cream.  Given symptoms initially began as cold sores we will also treat with antivirals.  Area appears to have allergic character so was prescribed prednisone burst with instruction not to take additional NSAIDs including aspirin, ibuprofen/Advil, naproxen/Aleve with this medication.  She was encouraged to use hypoallergenic soaps and detergents and avoid moisture as much as possible.  Strict return precautions given to which patient expressed understanding.  Discussed she is to be reevaluated in 1 week and if she is unable to get in with PCP she should return to clinic for further  evaluation.  Final Clinical Impressions(s) / UC Diagnoses   Final diagnoses:  Perioral dermatitis  Cold sore     Discharge Instructions     I am concerned that you have perioral dermatitis but do not know the exact cause.  We are going to try to treat this with several things including a topical cream to apply twice daily as well as antivirals to cover for cold sores.  We are also going to call prednisone into your pharmacy in case there is an allergic component.  While you are taking prednisone do not take ibuprofen, aspirin, Aleve due to risk of GI bleeding.  Please avoid moisture on the area and use hypoallergenic soaps and detergents.  Do not apply any other medications to the area.  Please follow-up with either our clinic or your PCP within 1 week to check on status.  If you have any worsening symptoms including trouble swallowing, trouble speaking, eating you need to go to the ER.    ED Prescriptions    Medication Sig Dispense Auth. Provider   metroNIDAZOLE (METROCREAM) 0.75 % cream Apply topically 2 (two) times daily. 45 g Keisi Eckford K, PA-C   predniSONE (DELTASONE) 20 MG tablet Take 2 tablets (40 mg total) by mouth daily with breakfast for 4 days. 8 tablet Ricci Paff K, PA-C   valACYclovir (VALTREX) 1000 MG tablet Take 1 tablet (1,000 mg total) by mouth 3 (three) times daily for 7 days. 21 tablet Laramie Meissner, Derry Skill, PA-C     PDMP not reviewed this encounter.   Terrilee Croak, PA-C 11/29/20 1259

## 2020-11-29 NOTE — Discharge Instructions (Signed)
I am concerned that you have perioral dermatitis but do not know the exact cause.  We are going to try to treat this with several things including a topical cream to apply twice daily as well as antivirals to cover for cold sores.  We are also going to call prednisone into your pharmacy in case there is an allergic component.  While you are taking prednisone do not take ibuprofen, aspirin, Aleve due to risk of GI bleeding.  Please avoid moisture on the area and use hypoallergenic soaps and detergents.  Do not apply any other medications to the area.  Please follow-up with either our clinic or your PCP within 1 week to check on status.  If you have any worsening symptoms including trouble swallowing, trouble speaking, eating you need to go to the ER.

## 2020-12-02 ENCOUNTER — Other Ambulatory Visit: Payer: Self-pay | Admitting: Family Medicine

## 2020-12-02 DIAGNOSIS — Z1231 Encounter for screening mammogram for malignant neoplasm of breast: Secondary | ICD-10-CM

## 2020-12-08 DIAGNOSIS — Z961 Presence of intraocular lens: Secondary | ICD-10-CM | POA: Diagnosis not present

## 2020-12-08 DIAGNOSIS — H35371 Puckering of macula, right eye: Secondary | ICD-10-CM | POA: Diagnosis not present

## 2020-12-08 DIAGNOSIS — H5703 Miosis: Secondary | ICD-10-CM | POA: Diagnosis not present

## 2020-12-08 DIAGNOSIS — H25012 Cortical age-related cataract, left eye: Secondary | ICD-10-CM | POA: Diagnosis not present

## 2020-12-08 DIAGNOSIS — H2512 Age-related nuclear cataract, left eye: Secondary | ICD-10-CM | POA: Diagnosis not present

## 2021-01-14 DIAGNOSIS — Z0000 Encounter for general adult medical examination without abnormal findings: Secondary | ICD-10-CM | POA: Diagnosis not present

## 2021-01-14 DIAGNOSIS — Z131 Encounter for screening for diabetes mellitus: Secondary | ICD-10-CM | POA: Diagnosis not present

## 2021-01-14 DIAGNOSIS — Z13220 Encounter for screening for lipoid disorders: Secondary | ICD-10-CM | POA: Diagnosis not present

## 2021-01-28 ENCOUNTER — Other Ambulatory Visit: Payer: Self-pay

## 2021-01-28 ENCOUNTER — Ambulatory Visit
Admission: RE | Admit: 2021-01-28 | Discharge: 2021-01-28 | Disposition: A | Discharge status: Home / Self Care | Payer: BC Managed Care – PPO | Source: Ambulatory Visit | Attending: Family Medicine | Admitting: Family Medicine

## 2021-01-28 DIAGNOSIS — Z1231 Encounter for screening mammogram for malignant neoplasm of breast: Secondary | ICD-10-CM

## 2021-03-18 DIAGNOSIS — L814 Other melanin hyperpigmentation: Secondary | ICD-10-CM | POA: Diagnosis not present

## 2021-03-18 DIAGNOSIS — L820 Inflamed seborrheic keratosis: Secondary | ICD-10-CM | POA: Diagnosis not present

## 2021-03-18 DIAGNOSIS — L821 Other seborrheic keratosis: Secondary | ICD-10-CM | POA: Diagnosis not present

## 2021-03-18 DIAGNOSIS — D225 Melanocytic nevi of trunk: Secondary | ICD-10-CM | POA: Diagnosis not present

## 2021-03-18 DIAGNOSIS — L578 Other skin changes due to chronic exposure to nonionizing radiation: Secondary | ICD-10-CM | POA: Diagnosis not present

## 2021-03-24 DIAGNOSIS — G4719 Other hypersomnia: Secondary | ICD-10-CM | POA: Diagnosis not present

## 2021-04-09 DIAGNOSIS — G4733 Obstructive sleep apnea (adult) (pediatric): Secondary | ICD-10-CM | POA: Diagnosis not present

## 2021-05-20 DIAGNOSIS — G4733 Obstructive sleep apnea (adult) (pediatric): Secondary | ICD-10-CM | POA: Diagnosis not present

## 2021-06-01 ENCOUNTER — Ambulatory Visit (INDEPENDENT_AMBULATORY_CARE_PROVIDER_SITE_OTHER): Payer: BC Managed Care – PPO | Admitting: Family Medicine

## 2021-06-01 ENCOUNTER — Encounter: Payer: Self-pay | Admitting: Family Medicine

## 2021-06-01 VITALS — BP 106/74 | Ht 64.0 in | Wt 117.0 lb

## 2021-06-01 DIAGNOSIS — M25511 Pain in right shoulder: Secondary | ICD-10-CM | POA: Diagnosis not present

## 2021-06-01 NOTE — Patient Instructions (Signed)
You have a mild rotator cuff strain. Voltaren gel up to 4 times a day topically for pain and inflammation as needed. Can take tylenol in addition to this. Consider physical therapy with transition to home exercise program. Ice or heat as needed. Do home exercise program with theraband and scapular stabilization exercises daily 3 sets of 10 once a day - these are very important for long term recovery. Follow up with me in 6 weeks but call me sooner if you're struggling.

## 2021-06-01 NOTE — Progress Notes (Signed)
PCP: Kelton Pillar, MD  Subjective:   HPI: Patient is a 65 y.o. female here for R shoulder pain.  R shoulder pain: She has been having issues with the right shoulder for the past 2 to 3 months.  She describes it more as discomfort vs pain.  Discomfort is located in the posterior aspect of the shoulder.  She does think she has decreased power on her overhead serve playing tennis.  She denies any injury or trauma to the right shoulder.  Discomfort does not keep her up at night.  She can also feel a discomfort with other overhead movements, and using her shoulder and taking.  She has not tried any meds, ice, heat, or exercises.  Past Medical History:  Diagnosis Date   Abnormal brain MRI    off HRT   Anemia    Migraine    PFO (patent foramen ovale)    Dr. Jeani Hawking Ward    Current Outpatient Medications on File Prior to Visit  Medication Sig Dispense Refill   Amino Acids (AMINO ACID PO) Take by mouth. Gabatone K-39 daily     cefdinir (OMNICEF) 300 MG capsule Take 300 mg by mouth 2 (two) times daily.     citalopram (CELEXA) 40 MG tablet TAKE 1 TABLET(40 MG) BY MOUTH DAILY 90 tablet 0   diazepam (VALIUM) 5 MG tablet 1/2 tablet as needed. 30 tablet 0   Ferrous Sulfate (IRON) 325 (65 Fe) MG TABS Take by mouth daily.     metroNIDAZOLE (METROCREAM) 0.75 % cream Apply topically 2 (two) times daily. 45 g 0   Probiotic Product (PROBIOTIC DAILY PO) Take by mouth daily.     No current facility-administered medications on file prior to visit.    Past Surgical History:  Procedure Laterality Date   CERVICAL FUSION     C5/6   CESAREAN SECTION     x 3   HYSTEROSCOPY WITH D & C  10/18/2011   Procedure: DILATATION AND CURETTAGE /HYSTEROSCOPY;  Surgeon: Megan Salon, MD;  Location: [facility];  Service: Gynecology;  Laterality: N/A;    Allergies  Allergen Reactions   Cephalosporins Other (See Comments)   Codeine Hives   Hyoscyamine Other (See Comments)    BP 106/74   Ht 5\' 4"  (1.626 m)   Wt 117  lb (53.1 kg)   LMP 05/26/2012   BMI 20.08 kg/m   [facility] Exercise 06/01/2021  Frequency of aerobic exercise (# of days/week) 6  Average time in minutes 35  Frequency of strengthening activities (# of days/week) 3    No flowsheet data found.      Objective:  Physical Exam:  Gen: NAD, comfortable in exam room HEENT: Normocephalic, atraumatic Resp: Breathing comfortably, no conversational dyspnea Psych: Normal mood MSK: -R shoulder: No bruising or swelling, increased muscle tone or the medial portion of supraspinatus, and T3 paraspinal muscles with mild tenderness; no scapular dyskinesia, full AROM and PROM , 5/5 strength in abduction and adduction, discomfort with active internal rotation.  Negative Yergason's, negative speeds, negative Neer's, weakly + Empty Can and Hawkins.  Negative o'brien's, sulcus, apprehension.   Assessment & Plan:  1. Rotator cuff strain  - Rotator cuff strengthening HEP focused on infraspinatus and supraspinatus and scapular stabilization. If no improvement within the next 4-6 weeks, consider formal PT - Voltaren gel  topical QID prn for pain, can add Tylenol as well - No restriction on tennis activity, can play as tolerated - F/u in 6 weeks or  sooner if needed  Adolm Joseph, DO Family Medicine, PGY-3  Patient seen and examined with resident.  Agree with her note and findings.  Patient with reassuring exam overall.  Consistent with mild overuse strain of supraspinatus, infraspinatus.  No findings at present to point to cervical spine pathology or impingement of suprascapular nerve.  Start with home exercise program which was reviewed today.  Ice or heat, voltaren gel.  F/u in 6 weeks.

## 2021-06-20 DIAGNOSIS — G4733 Obstructive sleep apnea (adult) (pediatric): Secondary | ICD-10-CM | POA: Diagnosis not present

## 2021-06-30 DIAGNOSIS — L0100 Impetigo, unspecified: Secondary | ICD-10-CM | POA: Diagnosis not present

## 2021-07-07 ENCOUNTER — Ambulatory Visit (HOSPITAL_COMMUNITY)
Admission: EM | Admit: 2021-07-07 | Discharge: 2021-07-07 | Disposition: A | Discharge status: Home / Self Care | Payer: BC Managed Care – PPO | Attending: Family Medicine | Admitting: Family Medicine

## 2021-07-07 ENCOUNTER — Encounter (HOSPITAL_COMMUNITY): Payer: Self-pay | Admitting: Emergency Medicine

## 2021-07-07 ENCOUNTER — Other Ambulatory Visit: Payer: Self-pay

## 2021-07-07 DIAGNOSIS — L710 Perioral dermatitis: Secondary | ICD-10-CM

## 2021-07-07 DIAGNOSIS — B001 Herpesviral vesicular dermatitis: Secondary | ICD-10-CM

## 2021-07-07 DIAGNOSIS — L0100 Impetigo, unspecified: Secondary | ICD-10-CM | POA: Diagnosis not present

## 2021-07-07 MED ORDER — METRONIDAZOLE 0.75 % EX CREA: TOPICAL_CREAM | Freq: Two times a day (BID) | CUTANEOUS | 0 refills | Status: DC

## 2021-07-07 MED ORDER — PREDNISONE 10 MG PO TABS: 20.0000 mg | ORAL_TABLET | Freq: Every day | ORAL | 0 refills | Status: AC

## 2021-07-07 MED ORDER — VALACYCLOVIR HCL 1 G PO TABS: 1000.0000 mg | ORAL_TABLET | Freq: Three times a day (TID) | ORAL | 0 refills | Status: DC

## 2021-07-07 MED ORDER — MUPIROCIN 2 % EX OINT: 1.0000 "application " | TOPICAL_OINTMENT | Freq: Two times a day (BID) | CUTANEOUS | 0 refills | Status: DC

## 2021-07-07 NOTE — ED Provider Notes (Signed)
[facility]    CSN: [id number] Arrival date & time: 07/07/21  2993      History   Chief Complaint Chief Complaint  Patient presents with   Facial Pain    HPI Angela Snyder is a 65 y.o. female.   Patient is here for lip/facial swelling.  She started with cold sores on upper/lower lips x 10 days, those were getting better using home remedies.  She then used a topical salve and they got worse about 1 day ago.  She started with redness around the upper and lower lips, pain, swelling.   She was seen in the urgent care in may for this same thing.  Given meds which helped quite a bit.   Past Medical History:  Diagnosis Date   Abnormal brain MRI    off HRT   Anemia    Migraine    PFO (patent foramen ovale)    Dr. Jeani Hawking Ward    Patient Active Problem List   Diagnosis Date Noted   Pain of paraspinal muscle 03/18/2017   Ostium secundum type atrial septal defect 09/05/2012   PFO (patent foramen ovale) 09/05/2012   Adjustment disorder with anxiety 08/28/2012   Cluster headache syndrome 08/28/2012   Degeneration of cervical intervertebral disc 08/28/2012   Symptomatic menopausal or female climacteric states 08/28/2012   Dizziness 08/28/2012   DDD (degenerative disc disease), cervical 08/28/2012   Head revolving around 08/28/2012   Anemia 10/18/2011    Past Surgical History:  Procedure Laterality Date   CERVICAL FUSION     C5/6   CESAREAN SECTION     x 3   HYSTEROSCOPY WITH D & C  10/18/2011   Procedure: DILATATION AND CURETTAGE /HYSTEROSCOPY;  Surgeon: Megan Salon, MD;  Location: [facility];  Service: Gynecology;  Laterality: N/A;    OB History     Gravida  4   Para  3   Term      Preterm      AB      Living  3      SAB      IAB      Ectopic      Multiple      Live Births               Home Medications    Prior to Admission medications   Medication Sig Start Date End Date Taking? Authorizing Provider  Amino Acids (AMINO ACID PO)  Take by mouth. Gabatone K-39 daily    [provider]  cefdinir (OMNICEF) 300 MG capsule Take 300 mg by mouth 2 (two) times daily. 11/10/20   [provider]  citalopram (CELEXA) 40 MG tablet TAKE 1 TABLET(40 MG) BY MOUTH DAILY 12/14/19   Megan Salon, MD  diazepam (VALIUM) 5 MG tablet 1/2 tablet as needed. 11/20/13   Megan Salon, MD  Ferrous Sulfate (IRON) 325 (65 Fe) MG TABS Take by mouth daily.    [provider]  metroNIDAZOLE (METROCREAM) 0.75 % cream Apply topically 2 (two) times daily. 11/29/20   Raspet, Derry Skill, PA-C  Probiotic Product (PROBIOTIC DAILY PO) Take by mouth daily.    [provider]    Family History Family History  Problem Relation Age of Onset   Heart disease Mother    Diabetes Mother    Hypertension Mother    Heart disease Father    Heart disease Brother    Diabetes Maternal Grandmother     Social History Social  History   Tobacco Use   Smoking status: Never   Smokeless tobacco: Never  Vaping Use   Vaping Use: Never used  Substance Use Topics   Alcohol use: Yes    Alcohol/week: 7.0 - 10.0 standard drinks    Types: 7 - 10 Standard drinks or equivalent per week   Drug use: No     Allergies   Cephalosporins, Codeine, and Hyoscyamine   Review of Systems Review of Systems  Constitutional: Negative.   HENT:  Positive for facial swelling and mouth sores.   Respiratory: Negative.    Cardiovascular: Negative.   Gastrointestinal: Negative.   Skin:  Positive for rash.    Physical Exam Triage Vital Signs ED Triage Vitals  Enc Vitals Group     BP 07/07/21 1145 126/88     Pulse Rate 07/07/21 1145 60     Resp 07/07/21 1145 16     Temp 07/07/21 1145 98.2 F (36.8 C)     Temp Source 07/07/21 1145 Oral     SpO2 07/07/21 1145 97 %     Weight --      Height --      Head Circumference --      Peak Flow --      Pain Score 07/07/21 1143 5     Pain Loc --      Pain Edu? --      Excl. in [place]? --    No data  found.  Updated Vital Signs BP 126/88 (BP Location: Right Arm)    Pulse 60    Temp 98.2 F (36.8 C) (Oral)    Resp 16    LMP 05/26/2012    SpO2 97%   Visual Acuity Right Eye Distance:   Left Eye Distance:   Bilateral Distance:    Right Eye Near:   Left Eye Near:    Bilateral Near:     Physical Exam HENT:     Mouth/Throat:     Mouth: Mucous membranes are moist.     Comments: The upper and lower lips are crusted over;  no active cold sores noted;  the skin around the upper and lower lips is red, swollen, dry, irritated;  there are yellow, honey crusted areas noted primarily at the corners of the lips;  Pulmonary:     Effort: Pulmonary effort is normal.  Musculoskeletal:     Cervical back: Normal range of motion and neck supple.  Neurological:     Mental Status: She is alert.     UC Treatments / Results  Labs (all labs ordered are listed, but only abnormal results are displayed) Labs Reviewed - No data to display  EKG   Radiology No results found.  Procedures Procedures (including critical care time)  Medications Ordered in UC Medications - No data to display  Initial Impression / Assessment and Plan / UC Course  I have reviewed the triage vital signs and the nursing notes.  Pertinent labs & imaging results that were available during my care of the patient were reviewed by me and considered in my medical decision making (see chart for details).   History of recurrent cold sores with subsequent peri-oral dermatitis, severe.  At this point valtrex will not be helpful, but rx given to have on hand for next outbreak.  I have given prednisone for pain/swelling, as well as topicals to treat infection.  Advised to follow up with her pcp for future outbreaks as soon as they occur.   Final Clinical  Impressions(s) / UC Diagnoses   Final diagnoses:  Perioral dermatitis  Recurrent cold sores  Impetigo     Discharge Instructions      You have been diagnosed with  perioral dermatitis, due to the severity of the cold sores and topicals used.  I have prescribed oral prednisone to be taken once daily in the morning with food.  You have also been prescribed topicals to help with the infection.   Valtrex was given today.  I do not think this will be useful at this time as you have had the cold sores for over a week, but please keep this on hand and plan to use as soon as you notice another outbreak of cold sores.      ED Prescriptions     Medication Sig Dispense Auth. Provider   predniSONE (DELTASONE) 10 MG tablet Take 2 tablets (20 mg total) by mouth daily for 5 days. 10 tablet Aarica Wax, MD   metroNIDAZOLE (METROCREAM) 0.75 % cream Apply topically 2 (two) times daily. 45 g Aranza Geddes, MD   mupirocin ointment (BACTROBAN) 2 % Apply 1 application topically 2 (two) times daily. 22 g Alijah Hyde, MD   valACYclovir (VALTREX) 1000 MG tablet Take 1 tablet (1,000 mg total) by mouth 3 (three) times daily. 21 tablet Rondel Oh, MD      PDMP not reviewed this encounter.   Rondel Oh, MD 07/07/21 1212

## 2021-07-07 NOTE — Discharge Instructions (Addendum)
You have been diagnosed with perioral dermatitis, due to the severity of the cold sores and topicals used.  I have prescribed oral prednisone to be taken once daily in the morning with food.  You have also been prescribed topicals to help with the infection.   Valtrex was given today.  I do not think this will be useful at this time as you have had the cold sores for over a week, but please keep this on hand and plan to use as soon as you notice another outbreak of cold sores.

## 2021-07-07 NOTE — ED Triage Notes (Signed)
Pt presents with mouth pain Xs 10 days. States developed fever blisters and used and organic salve.

## 2021-07-11 ENCOUNTER — Ambulatory Visit (HOSPITAL_COMMUNITY): Payer: BC Managed Care – PPO

## 2021-07-13 ENCOUNTER — Ambulatory Visit: Payer: BC Managed Care – PPO | Admitting: Family Medicine

## 2021-07-14 DIAGNOSIS — L089 Local infection of the skin and subcutaneous tissue, unspecified: Secondary | ICD-10-CM | POA: Diagnosis not present

## 2021-07-14 DIAGNOSIS — B002 Herpesviral gingivostomatitis and pharyngotonsillitis: Secondary | ICD-10-CM | POA: Diagnosis not present

## 2021-07-20 DIAGNOSIS — G4733 Obstructive sleep apnea (adult) (pediatric): Secondary | ICD-10-CM | POA: Diagnosis not present

## 2021-08-05 DIAGNOSIS — M545 Low back pain, unspecified: Secondary | ICD-10-CM | POA: Diagnosis not present

## 2021-08-05 DIAGNOSIS — L259 Unspecified contact dermatitis, unspecified cause: Secondary | ICD-10-CM | POA: Diagnosis not present

## 2021-08-10 DIAGNOSIS — S39012D Strain of muscle, fascia and tendon of lower back, subsequent encounter: Secondary | ICD-10-CM | POA: Diagnosis not present

## 2021-08-13 DIAGNOSIS — L259 Unspecified contact dermatitis, unspecified cause: Secondary | ICD-10-CM | POA: Diagnosis not present

## 2021-08-19 DIAGNOSIS — L239 Allergic contact dermatitis, unspecified cause: Secondary | ICD-10-CM | POA: Diagnosis not present

## 2021-08-20 DIAGNOSIS — G4733 Obstructive sleep apnea (adult) (pediatric): Secondary | ICD-10-CM | POA: Diagnosis not present

## 2021-08-21 DIAGNOSIS — L710 Perioral dermatitis: Secondary | ICD-10-CM | POA: Diagnosis not present

## 2021-08-24 DIAGNOSIS — L259 Unspecified contact dermatitis, unspecified cause: Secondary | ICD-10-CM | POA: Diagnosis not present

## 2021-08-25 DIAGNOSIS — S39012D Strain of muscle, fascia and tendon of lower back, subsequent encounter: Secondary | ICD-10-CM | POA: Diagnosis not present

## 2021-08-28 DIAGNOSIS — S39012D Strain of muscle, fascia and tendon of lower back, subsequent encounter: Secondary | ICD-10-CM | POA: Diagnosis not present

## 2021-09-02 DIAGNOSIS — S39012D Strain of muscle, fascia and tendon of lower back, subsequent encounter: Secondary | ICD-10-CM | POA: Diagnosis not present

## 2021-09-20 DIAGNOSIS — G4733 Obstructive sleep apnea (adult) (pediatric): Secondary | ICD-10-CM | POA: Diagnosis not present

## 2021-09-24 DIAGNOSIS — H6980 Other specified disorders of Eustachian tube, unspecified ear: Secondary | ICD-10-CM | POA: Diagnosis not present

## 2021-09-24 DIAGNOSIS — J019 Acute sinusitis, unspecified: Secondary | ICD-10-CM | POA: Diagnosis not present

## 2021-10-18 DIAGNOSIS — G4733 Obstructive sleep apnea (adult) (pediatric): Secondary | ICD-10-CM | POA: Diagnosis not present

## 2021-12-09 DIAGNOSIS — H2512 Age-related nuclear cataract, left eye: Secondary | ICD-10-CM | POA: Diagnosis not present

## 2021-12-09 DIAGNOSIS — H35371 Puckering of macula, right eye: Secondary | ICD-10-CM | POA: Diagnosis not present

## 2021-12-09 DIAGNOSIS — H524 Presbyopia: Secondary | ICD-10-CM | POA: Diagnosis not present

## 2021-12-09 DIAGNOSIS — H18603 Keratoconus, unspecified, bilateral: Secondary | ICD-10-CM | POA: Diagnosis not present

## 2021-12-09 DIAGNOSIS — H25012 Cortical age-related cataract, left eye: Secondary | ICD-10-CM | POA: Diagnosis not present

## 2021-12-29 DIAGNOSIS — H25812 Combined forms of age-related cataract, left eye: Secondary | ICD-10-CM | POA: Diagnosis not present

## 2021-12-29 DIAGNOSIS — H2512 Age-related nuclear cataract, left eye: Secondary | ICD-10-CM | POA: Diagnosis not present

## 2021-12-29 DIAGNOSIS — H25012 Cortical age-related cataract, left eye: Secondary | ICD-10-CM | POA: Diagnosis not present

## 2022-02-11 DIAGNOSIS — G4733 Obstructive sleep apnea (adult) (pediatric): Secondary | ICD-10-CM | POA: Diagnosis not present

## 2022-02-12 DIAGNOSIS — B009 Herpesviral infection, unspecified: Secondary | ICD-10-CM | POA: Diagnosis not present

## 2022-02-12 DIAGNOSIS — Z124 Encounter for screening for malignant neoplasm of cervix: Secondary | ICD-10-CM | POA: Diagnosis not present

## 2022-02-12 DIAGNOSIS — Z789 Other specified health status: Secondary | ICD-10-CM | POA: Diagnosis not present

## 2022-02-12 DIAGNOSIS — E7800 Pure hypercholesterolemia, unspecified: Secondary | ICD-10-CM | POA: Diagnosis not present

## 2022-02-12 DIAGNOSIS — Z0000 Encounter for general adult medical examination without abnormal findings: Secondary | ICD-10-CM | POA: Diagnosis not present

## 2022-02-12 DIAGNOSIS — N951 Menopausal and female climacteric states: Secondary | ICD-10-CM | POA: Diagnosis not present

## 2022-02-12 DIAGNOSIS — G4733 Obstructive sleep apnea (adult) (pediatric): Secondary | ICD-10-CM | POA: Diagnosis not present

## 2022-02-12 DIAGNOSIS — K30 Functional dyspepsia: Secondary | ICD-10-CM | POA: Diagnosis not present

## 2022-03-05 DIAGNOSIS — S60562A Insect bite (nonvenomous) of left hand, initial encounter: Secondary | ICD-10-CM | POA: Diagnosis not present

## 2022-04-13 ENCOUNTER — Ambulatory Visit: Payer: BC Managed Care – PPO | Admitting: Sports Medicine

## 2022-04-20 ENCOUNTER — Ambulatory Visit
Admission: RE | Admit: 2022-04-20 | Discharge: 2022-04-20 | Disposition: A | Discharge status: Home / Self Care | Payer: BC Managed Care – PPO | Source: Ambulatory Visit | Attending: Sports Medicine | Admitting: Sports Medicine

## 2022-04-20 ENCOUNTER — Ambulatory Visit (INDEPENDENT_AMBULATORY_CARE_PROVIDER_SITE_OTHER): Payer: BC Managed Care – PPO | Admitting: Sports Medicine

## 2022-04-20 VITALS — BP 128/70 | Ht 64.0 in | Wt 121.0 lb

## 2022-04-20 DIAGNOSIS — M25571 Pain in right ankle and joints of right foot: Secondary | ICD-10-CM

## 2022-04-20 DIAGNOSIS — M19071 Primary osteoarthritis, right ankle and foot: Secondary | ICD-10-CM | POA: Diagnosis not present

## 2022-04-20 NOTE — Progress Notes (Unsigned)
PCP: Kelton Pillar, MD  Subjective:   HPI: Patient is a 66 y.o. female here for right foot pain.  Patient reports 1 month of right foot pain.  May have had a mild ankle sprain a few weeks prior to developing the foot pain.  There was no specific injury that she can recall, but she had some ankle pain and swelling which later resolved.  Foot pain is located at the dorsal medial aspect of her right foot. She's still able to do all her normal activities including tennis and pickleball without difficulty, but the foot is sore afterwards.  Has not tried anything for relief.   Past Medical History:  Diagnosis Date   Abnormal brain MRI    off HRT   Anemia    Migraine    PFO (patent foramen ovale)    Dr. Jeani Hawking Ward    Current Outpatient Medications on File Prior to Visit  Medication Sig Dispense Refill   Amino Acids (AMINO ACID PO) Take by mouth. Gabatone K-39 daily     cefdinir (OMNICEF) 300 MG capsule Take 300 mg by mouth 2 (two) times daily.     citalopram (CELEXA) 40 MG tablet TAKE 1 TABLET(40 MG) BY MOUTH DAILY 90 tablet 0   diazepam (VALIUM) 5 MG tablet 1/2 tablet as needed. 30 tablet 0   Ferrous Sulfate (IRON) 325 (65 Fe) MG TABS Take by mouth daily.     metroNIDAZOLE (METROCREAM) 0.75 % cream Apply topically 2 (two) times daily. 45 g 0   mupirocin ointment (BACTROBAN) 2 % Apply 1 application topically 2 (two) times daily. 22 g 0   Probiotic Product (PROBIOTIC DAILY PO) Take by mouth daily.     valACYclovir (VALTREX) 1000 MG tablet Take 1 tablet (1,000 mg total) by mouth 3 (three) times daily. 21 tablet 0   No current facility-administered medications on file prior to visit.    Past Surgical History:  Procedure Laterality Date   CERVICAL FUSION     C5/6   CESAREAN SECTION     x 3   HYSTEROSCOPY WITH D & C  10/18/2011   Procedure: DILATATION AND CURETTAGE /HYSTEROSCOPY;  Surgeon: Megan Salon, MD;  Location: [facility];  Service: Gynecology;  Laterality: N/A;     Allergies  Allergen Reactions   Cephalosporins Other (See Comments)   Codeine Hives   Hyoscyamine Other (See Comments)    BP 128/70   Ht '5\' 4"'$  (1.626 m)   Wt 121 lb (54.9 kg)   LMP 12/25/2011   BMI 20.77 kg/m      06/01/2021    9:02 AM  [facility]  Frequency of aerobic exercise (# of days/week) 6  Average time in minutes 35  Frequency of strengthening activities (# of days/week) 3        No data to display              Objective:  Physical Exam:  Gen: NAD, comfortable in exam room  Right foot/ankle: Inspection is normal without obvious deformity, swelling, or skin changes.  Mild tenderness over the medial aspect of the navicular, remainder of foot and ankle are nontender.  Full range of motion with plantarflexion, dorsiflexion, inversion and eversion.  5/5 strength in all directions.  Negative talar tilt and anterior drawer.  Normal gait.  Neurovascularly intact.    Assessment & Plan:  1.  Right foot pain: Patient with 1 month of mild, atraumatic right foot pain.  Exam largely unremarkable aside  from slight tenderness over medial navicular region.  Suspect etiology is midfoot arthritis.  Presentation not consistent with anterior tibialis dysfunction.  Nothing on history to suggest stress fracture.  Will obtain x-rays for further evaluation and trial conservative approach with Voltaren gel as needed.    Alcus Dad, MD PGY-3, [facility]  Patient seen and evaluated with the resident.  I agree with the above plan of care.  X-rays were reviewed and are fairly unremarkable other than some very mild arthritis at the head of the first MTP.  I recommended good arch support when active and topical Voltaren.  If symptoms worsen we may need to consider merits of further diagnostic imaging.  Follow-up for ongoing or recalcitrant issues.

## 2022-04-21 ENCOUNTER — Encounter: Payer: Self-pay | Admitting: Sports Medicine

## 2022-05-04 ENCOUNTER — Other Ambulatory Visit: Payer: Self-pay | Admitting: Family Medicine

## 2022-05-04 DIAGNOSIS — Z1231 Encounter for screening mammogram for malignant neoplasm of breast: Secondary | ICD-10-CM

## 2022-05-13 ENCOUNTER — Ambulatory Visit (INDEPENDENT_AMBULATORY_CARE_PROVIDER_SITE_OTHER): Payer: Medicare Other | Admitting: Sports Medicine

## 2022-05-13 VITALS — BP 118/60 | Ht 64.0 in | Wt 122.0 lb

## 2022-05-13 DIAGNOSIS — S76319A Strain of muscle, fascia and tendon of the posterior muscle group at thigh level, unspecified thigh, initial encounter: Secondary | ICD-10-CM

## 2022-05-13 MED ORDER — MELOXICAM 15 MG PO TABS: ORAL_TABLET | ORAL | 0 refills | Status: AC

## 2022-05-13 NOTE — Progress Notes (Signed)
    SUBJECTIVE:   CHIEF COMPLAINT / HPI:   Patient is a 66 year old female who presents with bilateral gluteal muscle pain that started 3 weeks ago while playing pickle ball. Describes pain as non-radiating throbbing pain that is intermittent initially but has progressively gotten worse.  Pain was reproduced with playing pickle ball however recently she feels pain with walking.  She has tried some stretching exercise that initially helped.  Advil has not been effective.  Patient denies any trauma to the hip or affected area.  PERTINENT  PMH / PSH: Reviewed  OBJECTIVE:   BP 118/60   Ht '5\' 4"'$  (1.626 m)   Wt 122 lb (55.3 kg)   LMP 12/25/2011   BMI 20.94 kg/m    Physical Exam  General: Alert, well appearing, NAD  Gluteal exam No deformity. Tenderness to palpation within gluteal musculature. FROM with 5/5 strength except 4-/5 hip abduction with pain. No trochanter, SI joint, piriformis tenderness. Negative logroll. Negative faber, fadir.  Positive Trendelenburg test NVI distally.     ASSESSMENT/PLAN:  Patient's gluteal pain is likely due to piriformis strain. Relative rest for 3 weeks.  Abductor and hip strengthening exercises provided.  We will take meloxicam daily for 7 days.  Follow-up in 3 weeks for reevaluation.    Alen Bleacher, MD [facility]   Patient seen and evaluated with the resident.  I agree with the above plan of care.  Patient's location of pain is along the piriformis bilaterally.  She has a positive Trendelenburg and hip abductor weakness on exam.  We will give her home exercises to address both of these issues and she will need to take a little time off from pickleball.  She has already been doing some excellent piriformis stretches so she will continue with those as well.  We will  provide her a prescription for meloxicam with instructions to take this daily for 7 days and then as needed.  Follow-up with me again in 3 weeks for  reevaluation.  This note was dictated using Dragon naturally speaking software and may contain errors in syntax, spelling, or content which have not been identified prior to signing this note.

## 2022-05-13 NOTE — Patient Instructions (Signed)
It was wonderful to meet you today. Thank you for allowing me to be a part of your care. Below is a short summary of what we discussed at your visit today:  You likely have a piriformis muscle strain.  Commend relative rest for 3 weeks with hip strengthening exercises which have been provided to you.  Meloxicam daily for 7 days.  Follow-up in 3 weeks for reevaluation   Please bring all of your medications to every appointment!  If you have any questions or concerns, please do not hesitate to contact us via phone or MyChart message.   Alen Bleacher, MD [facility]

## 2022-05-27 ENCOUNTER — Ambulatory Visit
Admission: RE | Admit: 2022-05-27 | Discharge: 2022-05-27 | Disposition: A | Discharge status: Home / Self Care | Payer: Medicare Other | Source: Ambulatory Visit | Attending: Internal Medicine | Admitting: Internal Medicine

## 2022-05-27 ENCOUNTER — Other Ambulatory Visit: Payer: Self-pay | Admitting: Internal Medicine

## 2022-05-27 DIAGNOSIS — R059 Cough, unspecified: Secondary | ICD-10-CM | POA: Diagnosis not present

## 2022-06-03 ENCOUNTER — Ambulatory Visit: Payer: Medicare Other

## 2022-06-07 ENCOUNTER — Ambulatory Visit: Payer: BC Managed Care – PPO

## 2022-06-14 DIAGNOSIS — R5381 Other malaise: Secondary | ICD-10-CM | POA: Diagnosis not present

## 2022-06-14 DIAGNOSIS — R059 Cough, unspecified: Secondary | ICD-10-CM | POA: Diagnosis not present

## 2022-08-04 ENCOUNTER — Ambulatory Visit
Admission: RE | Admit: 2022-08-04 | Discharge: 2022-08-04 | Disposition: A | Discharge status: Home / Self Care | Payer: Medicare HMO | Source: Ambulatory Visit | Attending: Family Medicine | Admitting: Family Medicine

## 2022-08-04 DIAGNOSIS — Z1231 Encounter for screening mammogram for malignant neoplasm of breast: Secondary | ICD-10-CM

## 2022-08-05 ENCOUNTER — Other Ambulatory Visit: Payer: Self-pay | Admitting: Family Medicine

## 2022-08-05 DIAGNOSIS — R928 Other abnormal and inconclusive findings on diagnostic imaging of breast: Secondary | ICD-10-CM

## 2022-08-13 ENCOUNTER — Ambulatory Visit
Admission: RE | Admit: 2022-08-13 | Discharge: 2022-08-13 | Disposition: A | Discharge status: Home / Self Care | Payer: Medicare HMO | Source: Ambulatory Visit | Attending: Family Medicine | Admitting: Family Medicine

## 2022-08-13 ENCOUNTER — Ambulatory Visit: Payer: Medicare Other

## 2022-08-13 DIAGNOSIS — R922 Inconclusive mammogram: Secondary | ICD-10-CM | POA: Diagnosis not present

## 2022-08-13 DIAGNOSIS — R928 Other abnormal and inconclusive findings on diagnostic imaging of breast: Secondary | ICD-10-CM

## 2022-09-09 DIAGNOSIS — M25551 Pain in right hip: Secondary | ICD-10-CM | POA: Diagnosis not present

## 2022-09-09 DIAGNOSIS — R293 Abnormal posture: Secondary | ICD-10-CM | POA: Diagnosis not present

## 2022-09-09 DIAGNOSIS — M25552 Pain in left hip: Secondary | ICD-10-CM | POA: Diagnosis not present

## 2022-09-16 DIAGNOSIS — R293 Abnormal posture: Secondary | ICD-10-CM | POA: Diagnosis not present

## 2022-09-16 DIAGNOSIS — M25552 Pain in left hip: Secondary | ICD-10-CM | POA: Diagnosis not present

## 2022-09-16 DIAGNOSIS — M25551 Pain in right hip: Secondary | ICD-10-CM | POA: Diagnosis not present

## 2022-10-04 IMAGING — MG MM DIGITAL SCREENING BILAT W/ TOMO AND CAD
6 of 10 series · 6 of 30 positions shown · non-contrast
Comparison: Previous exam(s).

CLINICAL DATA: Screening.

EXAM:
DIGITAL SCREENING BILATERAL MAMMOGRAM WITH TOMOSYNTHESIS AND CAD
TECHNIQUE: Bilateral screening digital craniocaudal and mediolateral oblique
mammograms were obtained. Bilateral screening digital breast
tomosynthesis was performed. The images were evaluated with
computer-aided detection.

[R MLO synth-2D]
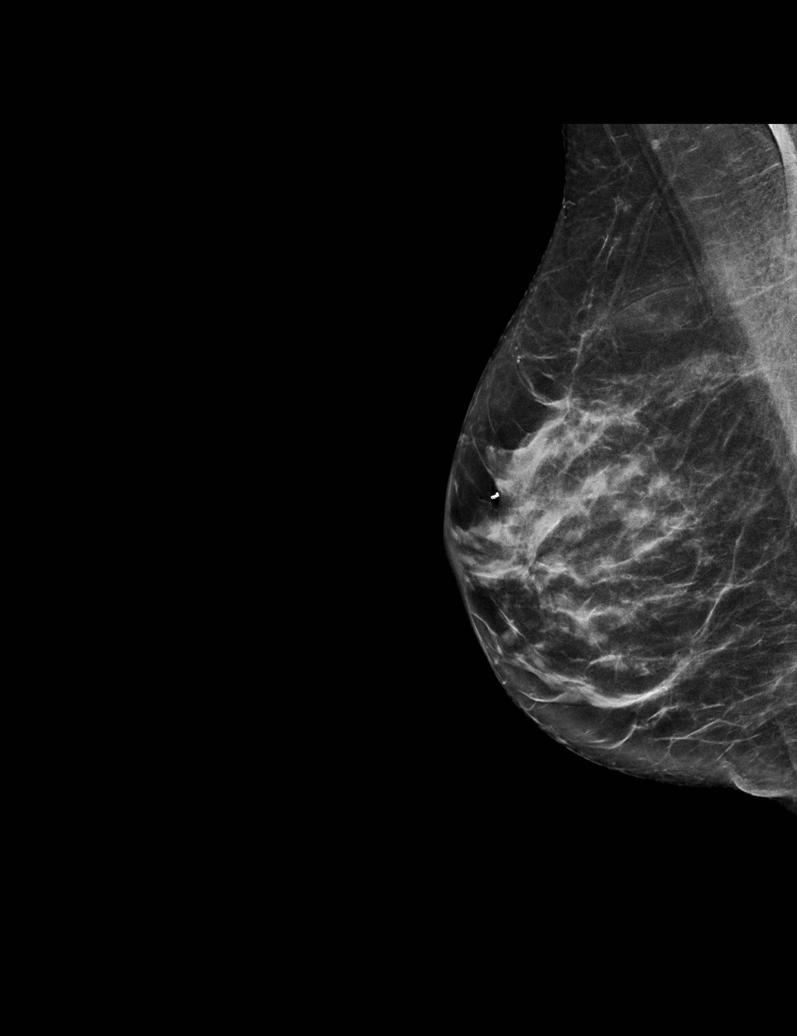

[L MLO synth-2D (1 of 2)]
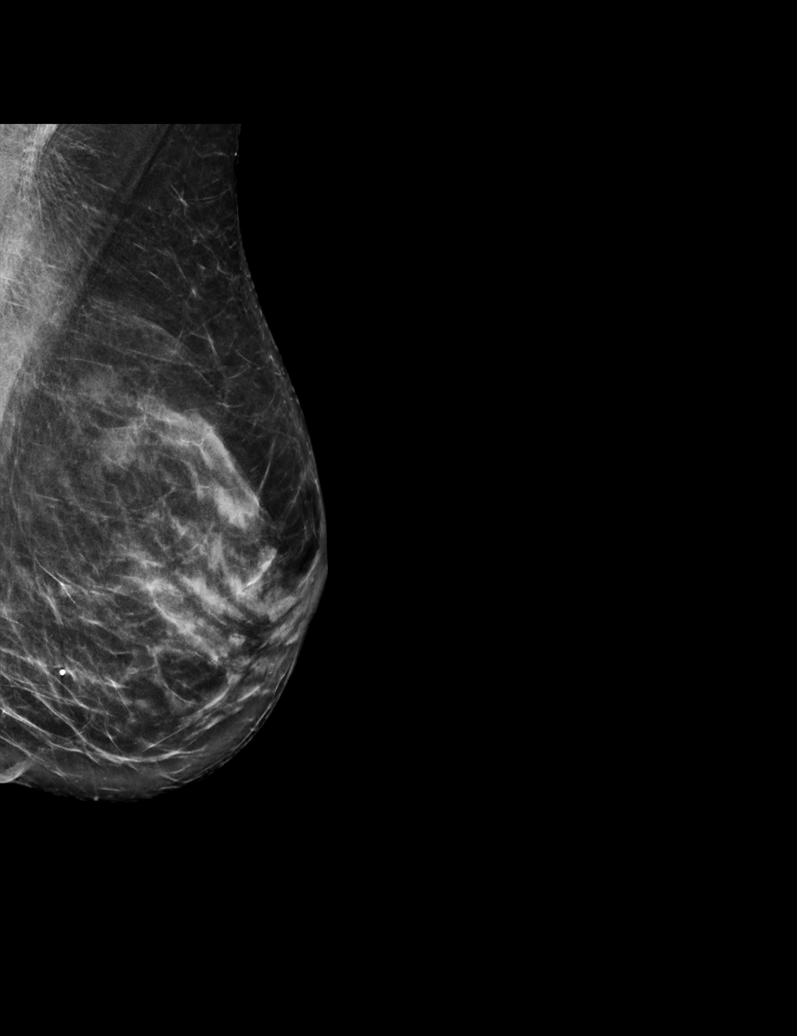

[L MLO synth-2D (2 of 2)]
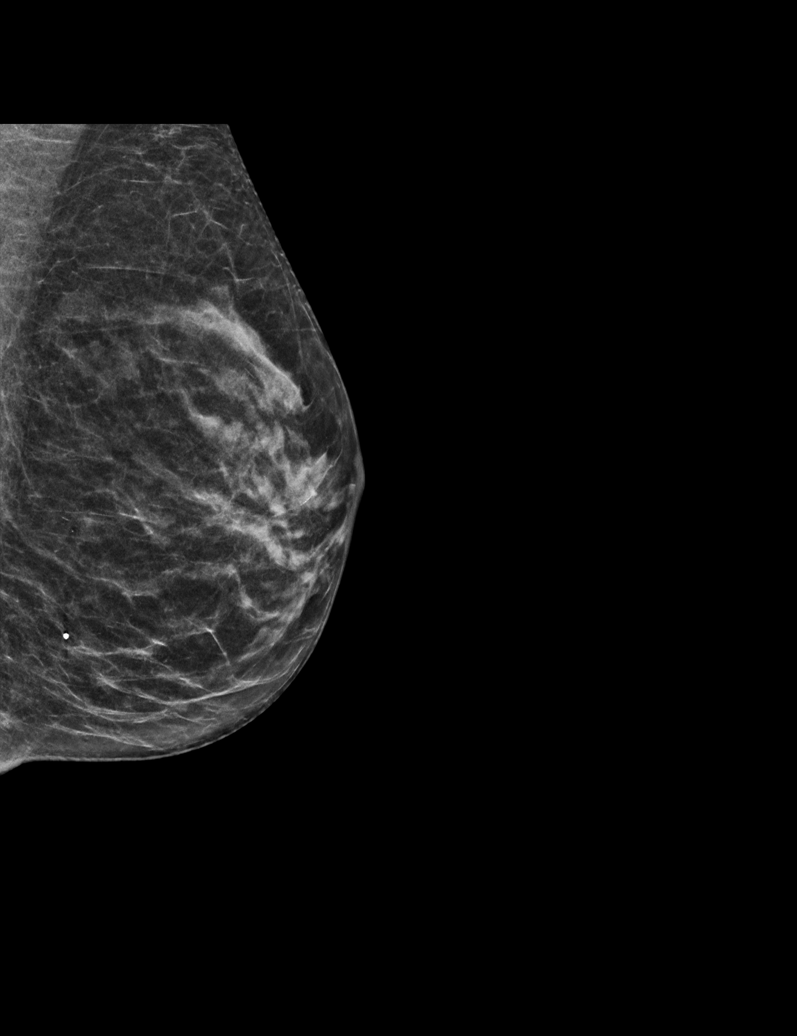

[R CC synth-2D]
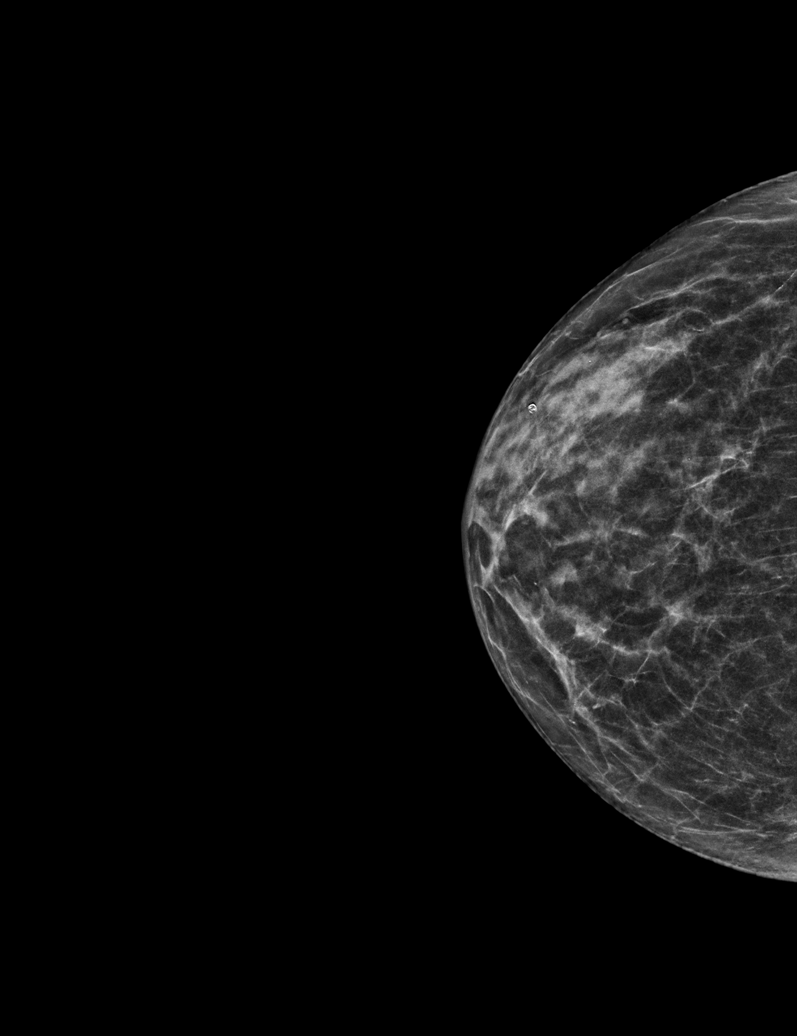

[L CC synth-2D]
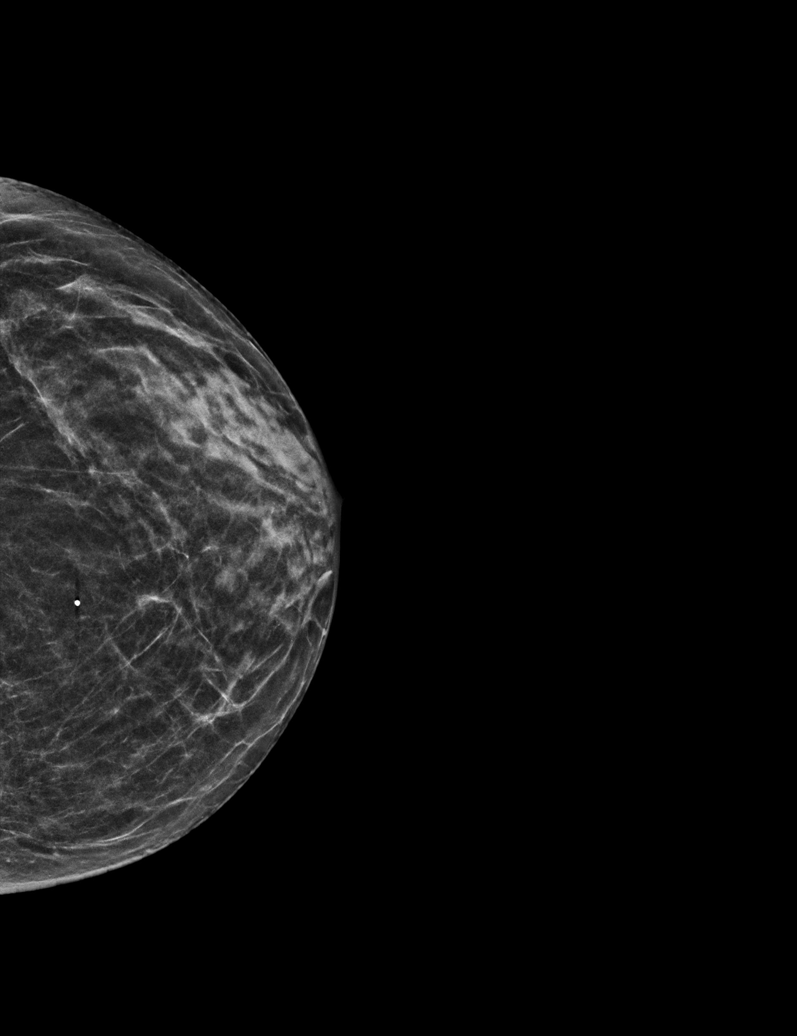

[L MLO tomo · tomo slice 24/47.0]
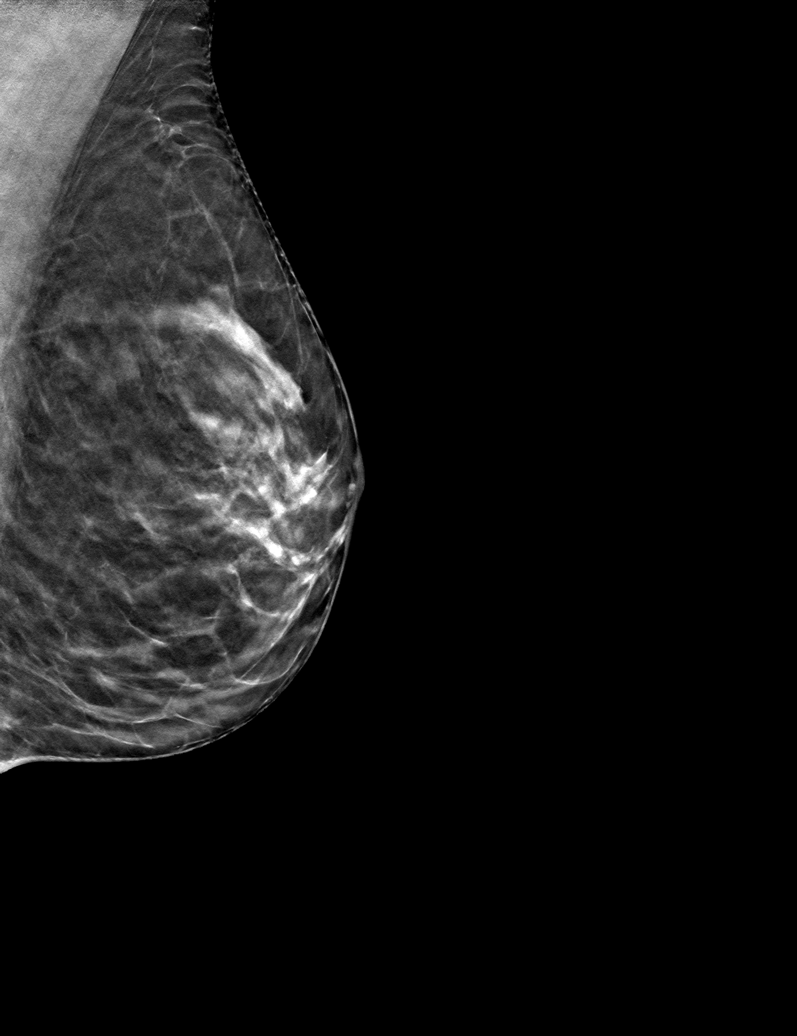

[6 of 30 positions shown; findings below may reference images not displayed]

ACR Breast Density Category c: The breast tissue is heterogeneously
dense, which may obscure small masses.
FINDINGS: There are no findings suspicious for malignancy.
IMPRESSION: No mammographic evidence of malignancy. A result letter of this
screening mammogram will be mailed directly to the patient.

RECOMMENDATION:
Screening mammogram in one year. (Code:Q3-W-BC3)

BI-RADS CATEGORY  1: Negative.

## 2022-11-09 DIAGNOSIS — L821 Other seborrheic keratosis: Secondary | ICD-10-CM | POA: Diagnosis not present

## 2022-11-09 DIAGNOSIS — L570 Actinic keratosis: Secondary | ICD-10-CM | POA: Diagnosis not present

## 2023-01-13 DIAGNOSIS — R519 Headache, unspecified: Secondary | ICD-10-CM | POA: Diagnosis not present

## 2023-01-21 ENCOUNTER — Other Ambulatory Visit: Payer: Self-pay | Admitting: Internal Medicine

## 2023-01-21 DIAGNOSIS — R519 Headache, unspecified: Secondary | ICD-10-CM

## 2023-02-06 ENCOUNTER — Other Ambulatory Visit: Payer: Self-pay

## 2023-02-06 ENCOUNTER — Encounter (HOSPITAL_COMMUNITY): Payer: Self-pay

## 2023-02-06 ENCOUNTER — Emergency Department (HOSPITAL_COMMUNITY): Payer: Medicare HMO

## 2023-02-06 ENCOUNTER — Emergency Department (HOSPITAL_COMMUNITY)
Admission: EM | Admit: 2023-02-06 | Discharge: 2023-02-06 | Disposition: A | Discharge status: Home / Self Care | Payer: Medicare HMO | Attending: Emergency Medicine | Admitting: Emergency Medicine

## 2023-02-06 DIAGNOSIS — R110 Nausea: Secondary | ICD-10-CM | POA: Diagnosis not present

## 2023-02-06 DIAGNOSIS — I672 Cerebral atherosclerosis: Secondary | ICD-10-CM | POA: Diagnosis not present

## 2023-02-06 DIAGNOSIS — R519 Headache, unspecified: Secondary | ICD-10-CM | POA: Diagnosis not present

## 2023-02-06 DIAGNOSIS — G4489 Other headache syndrome: Secondary | ICD-10-CM | POA: Diagnosis not present

## 2023-02-06 DIAGNOSIS — R42 Dizziness and giddiness: Secondary | ICD-10-CM | POA: Insufficient documentation

## 2023-02-06 DIAGNOSIS — G932 Benign intracranial hypertension: Secondary | ICD-10-CM | POA: Diagnosis not present

## 2023-02-06 LAB — BASIC METABOLIC PANEL
Anion gap: 8 (ref 5–15)
BUN: 10 mg/dL (ref 8–23)
CO2: 24 mmol/L (ref 22–32)
Calcium: 9.2 mg/dL (ref 8.9–10.3)
Chloride: 100 mmol/L (ref 98–111)
Creatinine, Ser: 0.73 mg/dL (ref 0.44–1.00)
GFR, Estimated: >60 mL/min (ref >60)
Glucose, Bld: 123 mg/dL — ABNORMAL HIGH (ref 70–99)
Potassium: 4.6 mmol/L (ref 3.5–5.1)
Sodium: 132 mmol/L — ABNORMAL LOW (ref 135–145)

## 2023-02-06 LAB — CBC WITH DIFFERENTIAL/PLATELET
Abs Immature Granulocytes: 0.01 10*3/uL (ref 0.00–0.07)
Basophils Absolute: 0.1 10*3/uL (ref 0.0–0.1)
Basophils Relative: 1 %
Eosinophils Absolute: 0.1 10*3/uL (ref 0.0–0.5)
Eosinophils Relative: 2 %
HCT: 39.4 % (ref 36.0–46.0)
Hemoglobin: 13.2 g/dL (ref 12.0–15.0)
Immature Granulocytes: 0 %
Lymphocytes Relative: 25 %
Lymphs Abs: 1.3 10*3/uL (ref 0.7–4.0)
MCH: 30.6 pg (ref 26.0–34.0)
MCHC: 33.5 g/dL (ref 30.0–36.0)
MCV: 91.4 fL (ref 80.0–100.0)
Monocytes Absolute: 0.4 10*3/uL (ref 0.1–1.0)
Monocytes Relative: 8 %
Neutro Abs: 3.2 10*3/uL (ref 1.7–7.7)
Neutrophils Relative %: 64 %
Platelets: 245 10*3/uL (ref 150–400)
RBC: 4.31 MIL/uL (ref 3.87–5.11)
RDW: 12.6 % (ref 11.5–15.5)
WBC: 5 10*3/uL (ref 4.0–10.5)
nRBC: 0 % (ref 0.0–0.2)

## 2023-02-06 MED ORDER — IOHEXOL 350 MG/ML SOLN
75.0000 mL | Freq: Once | INTRAVENOUS | Status: AC | PRN
  Administered 2023-02-06: 75 mL via INTRAVENOUS

## 2023-02-06 MED ORDER — DIPHENHYDRAMINE HCL 50 MG/ML IJ SOLN
25.0000 mg | Freq: Once | INTRAMUSCULAR | Status: AC
  Administered 2023-02-06: 25 mg via INTRAVENOUS
  Filled 2023-02-06: qty 1

## 2023-02-06 MED ORDER — PROCHLORPERAZINE EDISYLATE 10 MG/2ML IJ SOLN
10.0000 mg | Freq: Once | INTRAMUSCULAR | Status: AC
  Administered 2023-02-06: 10 mg via INTRAVENOUS
  Filled 2023-02-06: qty 2

## 2023-02-06 NOTE — Discharge Instructions (Signed)
Please follow-up with your primary care doctor after the MRI of the brain is completed.  The CT scan of the brain is negative for brain bleed.  The CT venogram does not show any blood clots.

## 2023-02-06 NOTE — ED Triage Notes (Addendum)
Pt BIB [facility] from home d/t a HA on the top of her head for the past few days. Does take herbal medicine for the past 10-15 yrs from this happening in the past. Does have outpatient Mri scheduled for this but came in today d/t worsening pain. A/Ox4, no PIV, VSS. Hx of migraines, denies blurred vision, endorses nausea.

## 2023-02-06 NOTE — ED Provider Notes (Signed)
[place] EMERGENCY DEPARTMENT AT [facility] Provider Note   CSN: [id number] Arrival date & time: 02/06/23  1445     History  Chief Complaint  Patient presents with   Headache    Angela Snyder is a 67 y.o. female.  HPI    Pt comes in with cc of headaches. Patient has no significant past medical history.  Patient has been having headache for the last 1 month.  She has seen her PCP for it, and is supposed to get an MRI.  The pain is located over the crown, and described as discomfort that is hard to describe.  Patient indicates that the pain is constant with no specific aggravating or relieving factors.  She denies any associated numbness, tingling, nausea, vomiting, one-sided weakness, numbness, vision change.  Today she had an episode of dizziness.  Dizziness is described as lightheadedness, and may be also some spinning sensation with it.  The episode lasted just for a few seconds, but had her concerned and she decided to come to the ER.  Home Medications Prior to Admission medications   Medication Sig Start Date End Date Taking? Authorizing Provider  Amino Acids (AMINO ACID PO) Take by mouth. Gabatone K-39 daily    [provider]  cefdinir (OMNICEF) 300 MG capsule Take 300 mg by mouth 2 (two) times daily. 11/10/20   [provider]  citalopram (CELEXA) 40 MG tablet TAKE 1 TABLET(40 MG) BY MOUTH DAILY 12/14/19   Jerene Bears, MD  diazepam (VALIUM) 5 MG tablet 1/2 tablet as needed. 11/20/13   Jerene Bears, MD  Ferrous Sulfate (IRON) 325 (65 Fe) MG TABS Take by mouth daily.    [provider]  meloxicam (MOBIC) 15 MG tablet Take one pill a day with food for 7 days and then prn thereafter 05/13/22   Ralene Cork, DO  metroNIDAZOLE (METROCREAM) 0.75 % cream Apply topically 2 (two) times daily. 07/07/21   Piontek, Denny Peon, MD  mupirocin ointment (BACTROBAN) 2 % Apply 1 application topically 2 (two) times daily. 07/07/21   Jannifer Franklin, MD   Probiotic Product (PROBIOTIC DAILY PO) Take by mouth daily.    [provider]  valACYclovir (VALTREX) 1000 MG tablet Take 1 tablet (1,000 mg total) by mouth 3 (three) times daily. 07/07/21   Jannifer Franklin, MD      Allergies    Cephalosporins, Codeine, and Hyoscyamine    Review of Systems   Review of Systems  All other systems reviewed and are negative.   Physical Exam Updated Vital Signs BP 113/69   Pulse 64   Temp 98 F (36.7 C) (Oral)   Resp 18   LMP 12/25/2011   SpO2 99%  Physical Exam Vitals and nursing note reviewed.  Constitutional:      Appearance: She is well-developed.  HENT:     Head: Atraumatic.  Eyes:     General: No visual field deficit.    Extraocular Movements: Extraocular movements intact.     Pupils: Pupils are equal, round, and reactive to light.  Cardiovascular:     Rate and Rhythm: Normal rate.  Pulmonary:     Effort: Pulmonary effort is normal.  Musculoskeletal:     Cervical back: Normal range of motion and neck supple.  Skin:    General: Skin is warm and dry.  Neurological:     Mental Status: She is alert and oriented to person, place, and time.     GCS: GCS eye subscore is  4. GCS verbal subscore is 5. GCS motor subscore is 6.     Cranial Nerves: No cranial nerve deficit, dysarthria or facial asymmetry.     Sensory: No sensory deficit.     Motor: No weakness.     ED Results / Procedures / Treatments   Labs (all labs ordered are listed, but only abnormal results are displayed) Labs Reviewed  BASIC METABOLIC PANEL - Abnormal; Notable for the following components:      Result Value   Sodium 132 (*)    Glucose, Bld 123 (*)    All other components within normal limits  CBC WITH DIFFERENTIAL/PLATELET    EKG EKG Interpretation Date/Time:  Sunday February 06 2023 14:49:09 EDT Ventricular Rate:  66 PR Interval:  145 QRS Duration:  133 QT Interval:  413 QTC Calculation: 433 R Axis:   50  Text Interpretation: Sinus rhythm  Nonspecific intraventricular conduction delay No acute changes No old tracing to compare Confirmed by Derwood Kaplan [phone]) on 02/06/2023 4:52:33 PM  Radiology CT VENOGRAM HEAD  Result Date: 02/06/2023 CLINICAL DATA:  Headache, intracranial hypertension features EXAM: CT VENOGRAM HEAD TECHNIQUE: Venographic phase images of the brain were obtained following the administration of intravenous contrast. Multiplanar reformats and maximum intensity projections were generated. RADIATION DOSE REDUCTION: This exam was performed according to the departmental dose-optimization program which includes automated exposure control, adjustment of the mA and/or kV according to patient size and/or use of iterative reconstruction technique. CONTRAST:  75mL OMNIPAQUE IOHEXOL 350 MG/ML SOLN COMPARISON:  None Available. FINDINGS: Brain: No evidence of acute infarct, hemorrhage, mass, mass effect, or midline shift. No hydrocephalus or extra-axial fluid collection. Normal pituitary and craniocervical junction. Vascular: No hyperdense vessel. Atherosclerotic calcifications in the intracranial carotid and vertebral arteries. Skull: Normal. Negative for fracture or focal lesion. Sinuses/Orbits: No acute finding. Other: The mastoid air cells are well aerated. Superior sagittal sinus: Normal. Straight sinus: Normal. Inferior sagittal sinus, vein of Galen and internal cerebral veins: Normal. Transverse sinuses: Normal. No narrowing distally, near the transverse-sigmoid junction. Sigmoid sinuses: Normal. Visualized jugular veins: Normal IMPRESSION: 1. No evidence of dural venous sinus thrombosis or stenosis. 2. No acute intracranial process. 3. No CT findings concerning for idiopathic intracranial hypertension. Electronically Signed   By: Wiliam Ke M.D.   On: 02/06/2023 20:13    Procedures Procedures    Medications Ordered in ED Medications  prochlorperazine (COMPAZINE) injection 10 mg (10 mg Intravenous Given 02/06/23 1843)   diphenhydrAMINE (BENADRYL) injection 25 mg (25 mg Intravenous Given 02/06/23 1845)  iohexol (OMNIPAQUE) 350 MG/ML injection 75 mL (75 mLs Intravenous Contrast Given 02/06/23 1843)    ED Course/ Medical Decision Making/ A&P                             Medical Decision Making Amount and/or Complexity of Data Reviewed Labs: ordered. Radiology: ordered.  Risk Prescription drug management.   This patient presents to the ED with chief complaint(s) of headache with associated dizziness. The complaint involves an extensive differential diagnosis and also carries with it a high risk of complications and morbidity.    The differential diagnosis considered includes : Primary headaches - including migrainous headaches, cluster headaches, tension headaches. ICH Carotid dissection Cavernous sinus thrombosis Tumor Vascular headaches AV malformation Brain aneurysm Muscular headaches  The initial plan is to get basic labs and CT venogram.  CT venogram, as patient's headache is been present now for several days, it is constant  and not indicative of aneurysmal etiology or dissection diabetes allergy.  Currently patient has no nystagmus, no dysmetria and she does not have dizziness.  No need for CT angio head and neck.   Additional history obtained: Additional history obtained from spouse Records reviewed Primary Care Documents  Independent labs interpretation:  The following labs were independently interpreted: CBC, BMP is reassuring  Independent visualization and interpretation of imaging: - I independently visualized the following imaging with scope of interpretation limited to determining acute life threatening conditions related to emergency care: CT scan of the brain, which revealed no evidence of brain bleed  Treatment and Reassessment: Upon reassessment, patient reports that the headache has resolved. She continued to have no neurologic complains. Strict return precautions  discussed, pt will return to the ER if there is visual complains, seizures, altered mental status, loss of consciousness, dizziness, new focal weakness, or numbness.    Final Clinical Impression(s) / ED Diagnoses Final diagnoses:  Frequent headaches  Dizziness    Rx / DC Orders ED Discharge Orders     None         Derwood Kaplan, MD 02/06/23 2026

## 2023-02-06 NOTE — ED Notes (Signed)
Patient transported to CT 

## 2023-02-09 DIAGNOSIS — R519 Headache, unspecified: Secondary | ICD-10-CM | POA: Diagnosis not present

## 2023-02-12 ENCOUNTER — Ambulatory Visit
Admission: RE | Admit: 2023-02-12 | Discharge: 2023-02-12 | Disposition: A | Discharge status: Home / Self Care | Payer: Medicare HMO | Source: Ambulatory Visit | Attending: Internal Medicine | Admitting: Internal Medicine

## 2023-02-12 DIAGNOSIS — R519 Headache, unspecified: Secondary | ICD-10-CM

## 2023-02-12 MED ORDER — GADOPICLENOL 0.5 MMOL/ML IV SOLN
5.0000 mL | Freq: Once | INTRAVENOUS | Status: AC | PRN
  Administered 2023-02-12: 5 mL via INTRAVENOUS

## 2023-02-22 DIAGNOSIS — G4733 Obstructive sleep apnea (adult) (pediatric): Secondary | ICD-10-CM | POA: Diagnosis not present

## 2023-02-22 DIAGNOSIS — E7800 Pure hypercholesterolemia, unspecified: Secondary | ICD-10-CM | POA: Diagnosis not present

## 2023-02-22 DIAGNOSIS — D72818 Other decreased white blood cell count: Secondary | ICD-10-CM | POA: Diagnosis not present

## 2023-02-22 DIAGNOSIS — M8588 Other specified disorders of bone density and structure, other site: Secondary | ICD-10-CM | POA: Diagnosis not present

## 2023-02-22 DIAGNOSIS — B009 Herpesviral infection, unspecified: Secondary | ICD-10-CM | POA: Diagnosis not present

## 2023-02-22 DIAGNOSIS — H6123 Impacted cerumen, bilateral: Secondary | ICD-10-CM | POA: Diagnosis not present

## 2023-02-22 DIAGNOSIS — R7301 Impaired fasting glucose: Secondary | ICD-10-CM | POA: Diagnosis not present

## 2023-02-22 DIAGNOSIS — Z789 Other specified health status: Secondary | ICD-10-CM | POA: Diagnosis not present

## 2023-02-22 DIAGNOSIS — Z23 Encounter for immunization: Secondary | ICD-10-CM | POA: Diagnosis not present

## 2023-02-22 DIAGNOSIS — R519 Headache, unspecified: Secondary | ICD-10-CM | POA: Diagnosis not present

## 2023-02-22 DIAGNOSIS — N951 Menopausal and female climacteric states: Secondary | ICD-10-CM | POA: Diagnosis not present

## 2023-02-22 DIAGNOSIS — Z0000 Encounter for general adult medical examination without abnormal findings: Secondary | ICD-10-CM | POA: Diagnosis not present

## 2023-02-25 ENCOUNTER — Other Ambulatory Visit: Payer: Self-pay | Admitting: Internal Medicine

## 2023-02-25 DIAGNOSIS — M8588 Other specified disorders of bone density and structure, other site: Secondary | ICD-10-CM

## 2023-03-09 DIAGNOSIS — N951 Menopausal and female climacteric states: Secondary | ICD-10-CM | POA: Diagnosis not present

## 2023-03-09 DIAGNOSIS — R519 Headache, unspecified: Secondary | ICD-10-CM | POA: Diagnosis not present

## 2023-03-22 DIAGNOSIS — Z049 Encounter for examination and observation for unspecified reason: Secondary | ICD-10-CM | POA: Diagnosis not present

## 2023-03-22 DIAGNOSIS — G43719 Chronic migraine without aura, intractable, without status migrainosus: Secondary | ICD-10-CM | POA: Diagnosis not present

## 2023-03-29 DIAGNOSIS — R413 Other amnesia: Secondary | ICD-10-CM | POA: Diagnosis not present

## 2023-03-29 DIAGNOSIS — G43911 Migraine, unspecified, intractable, with status migrainosus: Secondary | ICD-10-CM | POA: Diagnosis not present

## 2023-05-26 ENCOUNTER — Ambulatory Visit (HOSPITAL_COMMUNITY)
Admission: EM | Admit: 2023-05-26 | Discharge: 2023-05-26 | Disposition: A | Discharge status: Home / Self Care | Payer: Medicare HMO

## 2023-05-26 ENCOUNTER — Encounter (HOSPITAL_COMMUNITY): Payer: Self-pay

## 2023-05-26 DIAGNOSIS — M7711 Lateral epicondylitis, right elbow: Secondary | ICD-10-CM | POA: Diagnosis not present

## 2023-05-26 DIAGNOSIS — M7712 Lateral epicondylitis, left elbow: Secondary | ICD-10-CM

## 2023-05-26 NOTE — ED Provider Notes (Signed)
[facility]    CSN: [id number] Arrival date & time: 05/26/23  1218      History   Chief Complaint Chief Complaint  Patient presents with   Shortness of Breath    HPI Angela Snyder is a 66 y.o. female.   Angela Snyder is a 67 y.o. female presenting for chief complaint of tingling to the bilateral forearms that started a few days ago after she was playing tennis.  While she was playing tennis, she did not experience any numbness or tingling to the forearms.  Denies pain associated with the paresthesias.  Paresthesias resolve on their own without intervention after resting her arms.  She is right arm dominant and experiences worsening symptoms to the right arm versus the left arm.  Denies recent trauma or injuries to the elbows/wrists.  She has been feeling a little bit more fatigued after exercise recently as well but denies shortness of breath, chest pain, heart palpitations, and recent changes in physical activity.  She is very active and plays tennis/pickleball a few times a week.  Denies history of carpal tunnel syndrome.  Has not attempted use of any over-the-counter medications to help with symptoms PTA.   Shortness of Breath   Past Medical History:  Diagnosis Date   Abnormal brain MRI    off HRT   Anemia    Migraine    PFO (patent foramen ovale)    Dr. Georga Hacking Ward    Patient Active Problem List   Diagnosis Date Noted   Pain of paraspinal muscle 03/18/2017   Ostium secundum type atrial septal defect 09/05/2012   PFO (patent foramen ovale) 09/05/2012   Adjustment disorder with anxiety 08/28/2012   Cluster headache syndrome 08/28/2012   Degeneration of cervical intervertebral disc 08/28/2012   Symptomatic menopausal or female climacteric states 08/28/2012   Dizziness 08/28/2012   DDD (degenerative disc disease), cervical 08/28/2012   Head revolving around 08/28/2012   Anemia 10/18/2011    Past Surgical History:  Procedure Laterality Date   CERVICAL  FUSION     C5/6   CESAREAN SECTION     x 3   HYSTEROSCOPY WITH D & C  10/18/2011   Procedure: DILATATION AND CURETTAGE /HYSTEROSCOPY;  Surgeon: Jerene Bears, MD;  Location: [facility];  Service: Gynecology;  Laterality: N/A;    OB History     Gravida  4   Para  3   Term      Preterm      AB      Living  3      SAB      IAB      Ectopic      Multiple      Live Births               Home Medications    Prior to Admission medications   Medication Sig Start Date End Date Taking? Authorizing Provider  Amino Acids (AMINO ACID PO) Take by mouth. Gabatone K-39 daily    [provider]  citalopram (CELEXA) 40 MG tablet TAKE 1 TABLET(40 MG) BY MOUTH DAILY 12/14/19   Jerene Bears, MD  diazepam (VALIUM) 5 MG tablet 1/2 tablet as needed. 11/20/13   Jerene Bears, MD  Ferrous Sulfate (IRON) 325 (65 Fe) MG TABS Take by mouth daily.    [provider]  meloxicam (MOBIC) 15 MG tablet Take one pill a day with food for 7 days and then prn thereafter 05/13/22  Ralene Cork, DO  Probiotic Product (PROBIOTIC DAILY PO) Take by mouth daily.    [provider]    Family History Family History  Problem Relation Age of Onset   Heart disease Mother    Diabetes Mother    Hypertension Mother    Heart disease Father    Heart disease Brother    Diabetes Maternal Grandmother     Social History Social History   Tobacco Use   Smoking status: Never   Smokeless tobacco: Never  Vaping Use   Vaping status: Never Used  Substance Use Topics   Alcohol use: Yes    Alcohol/week: 7.0 - 10.0 standard drinks of alcohol    Types: 7 - 10 Standard drinks or equivalent per week   Drug use: No     Allergies   Cephalosporins, Codeine, and Hyoscyamine   Review of Systems Review of Systems  Respiratory:  Positive for shortness of breath.   Per HPI   Physical Exam Triage Vital Signs ED Triage Vitals  Encounter Vitals Group     BP 05/26/23 1400 (!)  145/80     Systolic BP Percentile --      Diastolic BP Percentile --      Pulse Rate 05/26/23 1400 65     Resp 05/26/23 1400 18     Temp 05/26/23 1400 98.2 F (36.8 C)     Temp Source 05/26/23 1400 Oral     SpO2 05/26/23 1400 99 %     Weight --      Height --      Head Circumference --      Peak Flow --      Pain Score 05/26/23 1401 0     Pain Loc --      Pain Education --      Exclude from Growth Chart --    No data found.  Updated Vital Signs BP (!) 145/80 (BP Location: Right Arm)   Pulse 65   Temp 98.2 F (36.8 C) (Oral)   Resp 18   LMP 12/25/2011   SpO2 99%   Visual Acuity Right Eye Distance:   Left Eye Distance:   Bilateral Distance:    Right Eye Near:   Left Eye Near:    Bilateral Near:     Physical Exam Vitals and nursing note reviewed.  Constitutional:      Appearance: She is not ill-appearing or toxic-appearing.  HENT:     Head: Normocephalic and atraumatic.     Right Ear: Hearing and external ear normal.     Left Ear: Hearing and external ear normal.     Nose: Nose normal.     Mouth/Throat:     Lips: Pink.  Eyes:     General: Lids are normal. Vision grossly intact. Gaze aligned appropriately.     Extraocular Movements: Extraocular movements intact.     Conjunctiva/sclera: Conjunctivae normal.  Pulmonary:     Effort: Pulmonary effort is normal.  Musculoskeletal:     Right elbow: Normal.     Left elbow: Normal.     Right forearm: Normal.     Left forearm: Normal.     Right wrist: Normal.     Left wrist: Normal.     Right hand: Normal.     Left hand: Normal.     Cervical back: Neck supple.     Comments: Phalen's test is negative.  Tinel's test is negative. Non-tender to palpation of the bilateral elbows.  Skin:  General: Skin is warm and dry.     Capillary Refill: Capillary refill takes less than 2 seconds.     Findings: No rash.  Neurological:     General: No focal deficit present.     Mental Status: She is alert and oriented to  person, place, and time. Mental status is at baseline.     Cranial Nerves: No dysarthria or facial asymmetry.  Psychiatric:        Mood and Affect: Mood normal.        Speech: Speech normal.        Behavior: Behavior normal.        Thought Content: Thought content normal.        Judgment: Judgment normal.      UC Treatments / Results  Labs (all labs ordered are listed, but only abnormal results are displayed) Labs Reviewed - No data to display  EKG   Radiology No results found.  Procedures Procedures (including critical care time)  Medications Ordered in UC Medications - No data to display  Initial Impression / Assessment and Plan / UC Course  I have reviewed the triage vital signs and the nursing notes.  Pertinent labs & imaging results that were available during my care of the patient were reviewed by me and considered in my medical decision making (see chart for details).   1.  Lateral epicondylitis of both elbows Presentation suspicious for lateral epicondylitis of both elbows.  Symptoms are mild currently. Discussed use of tennis elbow braces while she is playing tennis and at nighttime to ease symptoms in the morning.  May use Tylenol as needed for pain and paresthesias. Low suspicion for carpal tunnel syndrome. May follow-up with sports medicine as needed.  Counseled patient on potential for adverse effects with medications prescribed/recommended today, strict ER and return-to-clinic precautions discussed, patient verbalized understanding.    Final Clinical Impressions(s) / UC Diagnoses   Final diagnoses:  Lateral epicondylitis of both elbows   Discharge Instructions   None    ED Prescriptions   None    PDMP not reviewed this encounter.   Carlisle Beers, FNP 05/26/23 1440

## 2023-05-26 NOTE — ED Triage Notes (Signed)
Pt c/o intermittent SOB at times for a while. States has had episode more often x2 days. States sometimes has tingling down both arms after playing tennis or pickle ball. NAD.

## 2023-06-03 DIAGNOSIS — Z13220 Encounter for screening for lipoid disorders: Secondary | ICD-10-CM | POA: Diagnosis not present

## 2023-06-03 DIAGNOSIS — R0602 Shortness of breath: Secondary | ICD-10-CM | POA: Diagnosis not present

## 2023-06-03 DIAGNOSIS — Z131 Encounter for screening for diabetes mellitus: Secondary | ICD-10-CM | POA: Diagnosis not present

## 2023-06-14 DIAGNOSIS — Q2112 Patent foramen ovale: Secondary | ICD-10-CM | POA: Diagnosis not present

## 2023-06-14 DIAGNOSIS — R42 Dizziness and giddiness: Secondary | ICD-10-CM | POA: Diagnosis not present

## 2023-06-14 DIAGNOSIS — R5383 Other fatigue: Secondary | ICD-10-CM | POA: Diagnosis not present

## 2023-06-14 DIAGNOSIS — R0600 Dyspnea, unspecified: Secondary | ICD-10-CM | POA: Diagnosis not present

## 2023-06-21 DIAGNOSIS — R5383 Other fatigue: Secondary | ICD-10-CM | POA: Diagnosis not present

## 2023-07-05 DIAGNOSIS — R413 Other amnesia: Secondary | ICD-10-CM | POA: Diagnosis not present

## 2023-07-05 DIAGNOSIS — R202 Paresthesia of skin: Secondary | ICD-10-CM | POA: Diagnosis not present

## 2023-07-05 DIAGNOSIS — E559 Vitamin D deficiency, unspecified: Secondary | ICD-10-CM | POA: Diagnosis not present

## 2023-07-05 DIAGNOSIS — E538 Deficiency of other specified B group vitamins: Secondary | ICD-10-CM | POA: Diagnosis not present

## 2023-07-05 DIAGNOSIS — G43001 Migraine without aura, not intractable, with status migrainosus: Secondary | ICD-10-CM | POA: Diagnosis not present

## 2023-07-11 DIAGNOSIS — Q2112 Patent foramen ovale: Secondary | ICD-10-CM | POA: Diagnosis not present

## 2023-08-03 DIAGNOSIS — R0602 Shortness of breath: Secondary | ICD-10-CM | POA: Diagnosis not present

## 2023-08-04 DIAGNOSIS — Z1330 Encounter for screening examination for mental health and behavioral disorders, unspecified: Secondary | ICD-10-CM | POA: Diagnosis not present

## 2023-08-04 DIAGNOSIS — R0602 Shortness of breath: Secondary | ICD-10-CM | POA: Diagnosis not present

## 2023-08-04 DIAGNOSIS — R002 Palpitations: Secondary | ICD-10-CM | POA: Diagnosis not present

## 2023-08-04 DIAGNOSIS — Q2112 Patent foramen ovale: Secondary | ICD-10-CM | POA: Diagnosis not present

## 2023-08-05 DIAGNOSIS — R002 Palpitations: Secondary | ICD-10-CM | POA: Diagnosis not present

## 2023-08-10 DIAGNOSIS — R002 Palpitations: Secondary | ICD-10-CM | POA: Diagnosis not present

## 2023-08-18 DIAGNOSIS — R202 Paresthesia of skin: Secondary | ICD-10-CM | POA: Diagnosis not present

## 2023-08-23 DIAGNOSIS — R200 Anesthesia of skin: Secondary | ICD-10-CM | POA: Diagnosis not present

## 2023-08-23 DIAGNOSIS — R202 Paresthesia of skin: Secondary | ICD-10-CM | POA: Diagnosis not present

## 2023-08-30 ENCOUNTER — Other Ambulatory Visit: Payer: Medicare HMO

## 2023-08-30 DIAGNOSIS — E538 Deficiency of other specified B group vitamins: Secondary | ICD-10-CM | POA: Diagnosis not present

## 2023-08-30 DIAGNOSIS — G43001 Migraine without aura, not intractable, with status migrainosus: Secondary | ICD-10-CM | POA: Diagnosis not present

## 2023-08-30 DIAGNOSIS — R413 Other amnesia: Secondary | ICD-10-CM | POA: Diagnosis not present

## 2023-08-30 DIAGNOSIS — R202 Paresthesia of skin: Secondary | ICD-10-CM | POA: Diagnosis not present

## 2023-08-30 DIAGNOSIS — E559 Vitamin D deficiency, unspecified: Secondary | ICD-10-CM | POA: Diagnosis not present

## 2023-09-13 DIAGNOSIS — R0602 Shortness of breath: Secondary | ICD-10-CM | POA: Diagnosis not present

## 2023-09-16 ENCOUNTER — Other Ambulatory Visit: Payer: Self-pay | Admitting: Family Medicine

## 2023-09-16 DIAGNOSIS — Z1231 Encounter for screening mammogram for malignant neoplasm of breast: Secondary | ICD-10-CM

## 2023-09-19 DIAGNOSIS — R0602 Shortness of breath: Secondary | ICD-10-CM | POA: Diagnosis not present

## 2023-09-19 DIAGNOSIS — R200 Anesthesia of skin: Secondary | ICD-10-CM | POA: Diagnosis not present

## 2023-09-19 DIAGNOSIS — R202 Paresthesia of skin: Secondary | ICD-10-CM | POA: Diagnosis not present

## 2023-09-26 DIAGNOSIS — R202 Paresthesia of skin: Secondary | ICD-10-CM | POA: Diagnosis not present

## 2023-09-26 DIAGNOSIS — R200 Anesthesia of skin: Secondary | ICD-10-CM | POA: Diagnosis not present

## 2023-09-26 DIAGNOSIS — M47812 Spondylosis without myelopathy or radiculopathy, cervical region: Secondary | ICD-10-CM | POA: Diagnosis not present

## 2023-10-03 ENCOUNTER — Ambulatory Visit: Payer: Medicare HMO | Admitting: Cardiology

## 2023-10-03 DIAGNOSIS — E041 Nontoxic single thyroid nodule: Secondary | ICD-10-CM | POA: Diagnosis not present

## 2023-10-05 ENCOUNTER — Ambulatory Visit
Admission: RE | Admit: 2023-10-05 | Discharge: 2023-10-05 | Disposition: A | Discharge status: Home / Self Care | Payer: Medicare HMO | Source: Ambulatory Visit | Attending: Family Medicine | Admitting: Family Medicine

## 2023-10-05 DIAGNOSIS — Z1231 Encounter for screening mammogram for malignant neoplasm of breast: Secondary | ICD-10-CM

## 2023-10-24 DIAGNOSIS — R413 Other amnesia: Secondary | ICD-10-CM | POA: Diagnosis not present

## 2023-10-24 DIAGNOSIS — G43911 Migraine, unspecified, intractable, with status migrainosus: Secondary | ICD-10-CM | POA: Diagnosis not present

## 2023-10-24 DIAGNOSIS — R202 Paresthesia of skin: Secondary | ICD-10-CM | POA: Diagnosis not present

## 2023-10-31 DIAGNOSIS — J348210 External nasal valve collapse, unspecified: Secondary | ICD-10-CM | POA: Diagnosis not present

## 2023-10-31 DIAGNOSIS — R0609 Other forms of dyspnea: Secondary | ICD-10-CM | POA: Diagnosis not present

## 2023-11-03 DIAGNOSIS — R519 Headache, unspecified: Secondary | ICD-10-CM | POA: Diagnosis not present

## 2023-11-22 DIAGNOSIS — M5412 Radiculopathy, cervical region: Secondary | ICD-10-CM | POA: Diagnosis not present

## 2023-12-01 DIAGNOSIS — R0602 Shortness of breath: Secondary | ICD-10-CM | POA: Diagnosis not present

## 2023-12-01 DIAGNOSIS — R002 Palpitations: Secondary | ICD-10-CM | POA: Diagnosis not present

## 2023-12-01 DIAGNOSIS — Q2112 Patent foramen ovale: Secondary | ICD-10-CM | POA: Diagnosis not present

## 2023-12-13 DIAGNOSIS — Q2112 Patent foramen ovale: Secondary | ICD-10-CM | POA: Diagnosis not present

## 2023-12-13 DIAGNOSIS — R0609 Other forms of dyspnea: Secondary | ICD-10-CM | POA: Diagnosis not present

## 2023-12-13 DIAGNOSIS — R002 Palpitations: Secondary | ICD-10-CM | POA: Diagnosis not present

## 2024-01-03 DIAGNOSIS — M79642 Pain in left hand: Secondary | ICD-10-CM | POA: Diagnosis not present

## 2024-01-03 DIAGNOSIS — M79641 Pain in right hand: Secondary | ICD-10-CM | POA: Diagnosis not present

## 2024-01-10 DIAGNOSIS — M19042 Primary osteoarthritis, left hand: Secondary | ICD-10-CM | POA: Diagnosis not present

## 2024-01-10 DIAGNOSIS — M19041 Primary osteoarthritis, right hand: Secondary | ICD-10-CM | POA: Diagnosis not present

## 2024-01-17 DIAGNOSIS — R0609 Other forms of dyspnea: Secondary | ICD-10-CM | POA: Diagnosis not present

## 2024-01-17 DIAGNOSIS — Q2112 Patent foramen ovale: Secondary | ICD-10-CM | POA: Diagnosis not present

## 2024-01-17 DIAGNOSIS — R002 Palpitations: Secondary | ICD-10-CM | POA: Diagnosis not present

## 2024-02-09 DIAGNOSIS — M180 Bilateral primary osteoarthritis of first carpometacarpal joints: Secondary | ICD-10-CM | POA: Diagnosis not present

## 2024-02-09 DIAGNOSIS — R0609 Other forms of dyspnea: Secondary | ICD-10-CM | POA: Diagnosis not present

## 2024-02-14 DIAGNOSIS — R202 Paresthesia of skin: Secondary | ICD-10-CM | POA: Diagnosis not present

## 2024-02-14 DIAGNOSIS — G43001 Migraine without aura, not intractable, with status migrainosus: Secondary | ICD-10-CM | POA: Diagnosis not present

## 2024-02-14 DIAGNOSIS — M5412 Radiculopathy, cervical region: Secondary | ICD-10-CM | POA: Diagnosis not present

## 2024-02-27 DIAGNOSIS — H9313 Tinnitus, bilateral: Secondary | ICD-10-CM | POA: Diagnosis not present

## 2024-03-01 DIAGNOSIS — E041 Nontoxic single thyroid nodule: Secondary | ICD-10-CM | POA: Diagnosis not present

## 2024-03-23 ENCOUNTER — Encounter (INDEPENDENT_AMBULATORY_CARE_PROVIDER_SITE_OTHER): Payer: Self-pay

## 2024-03-27 DIAGNOSIS — F4321 Adjustment disorder with depressed mood: Secondary | ICD-10-CM | POA: Diagnosis not present

## 2024-03-29 DIAGNOSIS — E041 Nontoxic single thyroid nodule: Secondary | ICD-10-CM | POA: Diagnosis not present

## 2024-04-09 DIAGNOSIS — F4321 Adjustment disorder with depressed mood: Secondary | ICD-10-CM | POA: Diagnosis not present

## 2024-04-16 DIAGNOSIS — R0981 Nasal congestion: Secondary | ICD-10-CM | POA: Diagnosis not present

## 2024-05-07 DIAGNOSIS — H9313 Tinnitus, bilateral: Secondary | ICD-10-CM | POA: Diagnosis not present

## 2024-05-16 ENCOUNTER — Institutional Professional Consult (permissible substitution) (INDEPENDENT_AMBULATORY_CARE_PROVIDER_SITE_OTHER): Admitting: Otolaryngology

## 2024-05-24 DIAGNOSIS — D225 Melanocytic nevi of trunk: Secondary | ICD-10-CM | POA: Diagnosis not present

## 2024-05-24 DIAGNOSIS — L821 Other seborrheic keratosis: Secondary | ICD-10-CM | POA: Diagnosis not present

## 2024-05-24 DIAGNOSIS — L570 Actinic keratosis: Secondary | ICD-10-CM | POA: Diagnosis not present

## 2024-05-24 DIAGNOSIS — L578 Other skin changes due to chronic exposure to nonionizing radiation: Secondary | ICD-10-CM | POA: Diagnosis not present

## 2024-05-24 DIAGNOSIS — L814 Other melanin hyperpigmentation: Secondary | ICD-10-CM | POA: Diagnosis not present

## 2024-05-24 DIAGNOSIS — L813 Cafe au lait spots: Secondary | ICD-10-CM | POA: Diagnosis not present

## 2024-05-29 DIAGNOSIS — G3184 Mild cognitive impairment, so stated: Secondary | ICD-10-CM | POA: Diagnosis not present

## 2024-05-30 ENCOUNTER — Ambulatory Visit (INDEPENDENT_AMBULATORY_CARE_PROVIDER_SITE_OTHER)

## 2024-05-30 ENCOUNTER — Encounter (INDEPENDENT_AMBULATORY_CARE_PROVIDER_SITE_OTHER): Payer: Self-pay

## 2024-05-30 VITALS — BP 108/76 | HR 65 | Ht 64.0 in | Wt 127.0 lb

## 2024-05-30 DIAGNOSIS — H9313 Tinnitus, bilateral: Secondary | ICD-10-CM

## 2024-05-30 DIAGNOSIS — G4733 Obstructive sleep apnea (adult) (pediatric): Secondary | ICD-10-CM | POA: Diagnosis not present

## 2024-05-30 NOTE — Progress Notes (Signed)
 Dear Dr. Jhon, Here is my assessment for our mutual patient, Angela Snyder. Thank you for allowing me the opportunity to care for your patient. Please do not hesitate to contact me should you have any other questions. Sincerely, Dr. Hadassah Parody  Otolaryngology Clinic Note Referring provider: Dr. Jhon HPI:   Initial HPI (05/30/2024) Discussed the use of AI scribe software for clinical note transcription with the patient, who gave verbal consent to proceed.  History of Present Illness Angela Snyder is a 68 year old female who presents with bothersome bilateral tinnitus.  Tinnitus - Bilateral ringing in the ears present for the past few months - Symptoms are more noticeable and disruptive at night, interfering with sleep - Ambient sounds during the day reduce the perception of tinnitus - Noise masking techniques have not provided relief  Auditory function - Hearing test [facility] demonstrate essentially normal hearing per pt report  - No decrease in hearing perceived  - No sensation of ear fullness  Otalgia - Occasional mild ear pain, infrequent and not severe  Vestibular symptoms - No balance issues    Independent Review of Additional Tests or Records:  Referral note Elveria Jhon, MD (03/23/24): ringing in both ears for 2 weeks, referred to audiology   Urgent care visit Darryle Fish, PA-C (05/07/24): ringing in ears 2 months can't sleep    PMH/Meds/All/SocHx/FamHx/ROS:   Past Medical History:  Diagnosis Date   Abnormal brain MRI    off HRT   Anemia    Migraine    PFO (patent foramen ovale)    Dr. Rodgers Ward     Past Surgical History:  Procedure Laterality Date   CERVICAL FUSION     C5/6   CESAREAN SECTION     x 3   HYSTEROSCOPY WITH D & C  10/18/2011   Procedure: DILATATION AND CURETTAGE /HYSTEROSCOPY;  Surgeon: Ronal GORMAN Pinal, MD;  Location: [facility];  Service: Gynecology;  Laterality: N/A;    Family History  Problem Relation Age of Onset   Heart  disease Mother    Diabetes Mother    Hypertension Mother    Heart disease Father    Heart disease Brother    Diabetes Maternal Grandmother      Social Connections: Not on file     Current Outpatient Medications  Medication Instructions   Amino Acids (AMINO ACID PO) Take by mouth. Gabatone K-39 daily   citalopram  (CELEXA ) 40 MG tablet TAKE 1 TABLET(40 MG) BY MOUTH DAILY   diazepam  (VALIUM ) 5 MG tablet 1/2 tablet as needed.   Ferrous Sulfate (IRON) 325 (65 Fe) MG TABS Daily   meloxicam  (MOBIC ) 15 MG tablet Take one pill a day with food for 7 days and then prn thereafter   Probiotic Product (PROBIOTIC DAILY PO) Daily     Physical Exam:   BP 108/76   Pulse 65   Ht 5' 4 (1.626 m)   Wt 127 lb (57.6 kg)   LMP 12/25/2011   SpO2 94%   BMI 21.80 kg/m   Salient findings:  CN II-XII intact Given history and complaints, ear microscopy was indicated and performed for evaluation with findings as below in physical exam section and in procedures Bilateral EAC clear and TM intact with well pneumatized middle ear spaces No lesions of oral cavity/oropharynx No respiratory distress or stridor  Seprately Identifiable Procedures:  Prior to initiating any procedures, risks/benefits/alternatives were explained to the patient and verbal consent obtained.  Procedure (05/30/2024): Bilateral ear microscopy using microscope (  CPT [id number]) Pre-procedure diagnosis: bilateral tinnitus  Post-procedure diagnosis: same Indication: see above; given patient's otologic complaints and history, for improved and comprehensive examination of external ear and tympanic membrane, bilateral otologic examination using microscope was performed. Prior to proceeding, verbal consent was obtained after discussion of R/B/A  Procedure: Patient was placed semi-recumbent. Both ear canals were examined using the microscope with findings above. Patient tolerated the procedure well.   Impression & Plans:  Cassaundra Rasch is a 68  y.o. female with   1. Tinnitus of both ears    Assessment and Plan Assessment & Plan Bilateral tinnitus with associated sleep disturbance Bilateral tinnitus affecting sleep. Differential includes hearing loss and possibly variant Meniere's disease, though Meniere's is less likely. - Obtain audiogram results from [facility]. - Could trial dietary salt restriction to <2000 mg/day. - Recommended noise machines or sleep aids for sleep disturbance. - Follow-up in a few weeks to review audiogram     See below regarding exact medications prescribed this encounter including dosages and route: No orders of the defined types were placed in this encounter.     Thank you for allowing me the opportunity to care for your patient. Please do not hesitate to contact me should you have any other questions.  Sincerely, Hadassah Parody, MD Otolaryngologist (ENT), [facility] Phone: [phone] Fax: [phone]

## 2024-06-03 DIAGNOSIS — R300 Dysuria: Secondary | ICD-10-CM | POA: Diagnosis not present

## 2024-06-03 DIAGNOSIS — R399 Unspecified symptoms and signs involving the genitourinary system: Secondary | ICD-10-CM | POA: Diagnosis not present

## 2024-06-12 DIAGNOSIS — Z13220 Encounter for screening for lipoid disorders: Secondary | ICD-10-CM | POA: Diagnosis not present

## 2024-06-12 DIAGNOSIS — Z780 Asymptomatic menopausal state: Secondary | ICD-10-CM | POA: Diagnosis not present

## 2024-06-12 DIAGNOSIS — Z131 Encounter for screening for diabetes mellitus: Secondary | ICD-10-CM | POA: Diagnosis not present

## 2024-06-12 DIAGNOSIS — Z0000 Encounter for general adult medical examination without abnormal findings: Secondary | ICD-10-CM | POA: Diagnosis not present

## 2024-06-18 ENCOUNTER — Ambulatory Visit (INDEPENDENT_AMBULATORY_CARE_PROVIDER_SITE_OTHER)

## 2024-06-18 ENCOUNTER — Encounter (INDEPENDENT_AMBULATORY_CARE_PROVIDER_SITE_OTHER): Payer: Self-pay

## 2024-06-18 VITALS — BP 104/72 | HR 77

## 2024-06-18 DIAGNOSIS — H9313 Tinnitus, bilateral: Secondary | ICD-10-CM | POA: Diagnosis not present

## 2024-06-18 DIAGNOSIS — H903 Sensorineural hearing loss, bilateral: Secondary | ICD-10-CM

## 2024-06-19 DIAGNOSIS — Z780 Asymptomatic menopausal state: Secondary | ICD-10-CM | POA: Diagnosis not present

## 2024-06-19 DIAGNOSIS — Z13820 Encounter for screening for osteoporosis: Secondary | ICD-10-CM | POA: Diagnosis not present

## 2024-06-19 NOTE — Progress Notes (Signed)
 Dear Dr. Rexford ref. provider found, Here is my assessment for our mutual patient, Angela Snyder. Thank you for allowing me the opportunity to care for your patient. Please do not hesitate to contact me should you have any other questions. Sincerely, Dr. Hadassah Parody  Otolaryngology Clinic Note Referring provider: Dr. Rexford ref. provider found HPI:   Initial HPI (05/30/24) Discussed the use of AI scribe software for clinical note transcription with the patient, who gave verbal consent to proceed.  [name] is a 68 year old female who presents with bothersome bilateral tinnitus.  Tinnitus - Bilateral ringing in the ears present for the past few months - Symptoms are more noticeable and disruptive at night, interfering with sleep - Ambient sounds during the day reduce the perception of tinnitus - Noise masking techniques have not provided relief  Auditory function - Hearing test AIM Hearing demonstrate essentially normal hearing per pt report  - No decrease in hearing perceived  - No sensation of ear fullness  Otalgia - Occasional mild ear pain, infrequent and not severe  Vestibular symptoms - No balance issues  History of Present Illness --------------------------------------------------------- 06/18/2024   - Persistent tinnitus affecting both ears, with no change in symptoms since the last visit - Right ear occasionally feels sore or tired, with a dull sensation more pronounced on the right side - Ear discomfort is bothersome but not painful and does not radiate beyond the ear - Discomfort occasionally disrupts sleep, but is manageable during the day - her main concern today is the tinnitus   Hearing loss - Recent hearing test demonstrates generally good hearing with minor high-frequency hearing loss   Independent Review of Additional Tests or Records:  Referral note Elveria Kaiser, MD (03/23/24): ringing in both ears for 2 weeks, referred to audiology   Urgent care  visit Darryle Fish, PA-C (05/07/24): ringing in ears 2 months can't sleep   03/28/24 Audiogram was independently reviewed and interpreted by me and it reveals Right ear: normal sloping to moderate to moderately HL in high frequencies; type As tympanogram Left ear: normal sloping to moderate HL in high frequencies; type A tympanogram             PMH/Meds/All/SocHx/FamHx/ROS:   Past Medical History:  Diagnosis Date   Abnormal brain MRI    off HRT   Anemia    Migraine    PFO (patent foramen ovale)    Dr. Rodgers Ward     Past Surgical History:  Procedure Laterality Date   CERVICAL FUSION     C5/6   CESAREAN SECTION     x 3   HYSTEROSCOPY WITH D & C  10/18/2011   Procedure: DILATATION AND CURETTAGE /HYSTEROSCOPY;  Surgeon: Ronal GORMAN Pinal, MD;  Location: [facility];  Service: Gynecology;  Laterality: N/A;    Family History  Problem Relation Age of Onset   Heart disease Mother    Diabetes Mother    Hypertension Mother    Heart disease Father    Heart disease Brother    Diabetes Maternal Grandmother      Social Connections: Not on file     Current Outpatient Medications  Medication Instructions   Amino Acids (AMINO ACID PO) Take by mouth. Gabatone K-39 daily   citalopram  (CELEXA ) 40 MG tablet TAKE 1 TABLET(40 MG) BY MOUTH DAILY   diazepam  (VALIUM ) 5 MG tablet 1/2 tablet as needed.   Ferrous Sulfate (IRON) 325 (65 Fe) MG TABS Daily   meloxicam  (MOBIC ) 15 MG  tablet Take one pill a day with food for 7 days and then prn thereafter   Probiotic Product (PROBIOTIC DAILY PO) Daily     Physical Exam:   BP 104/72   Pulse 77   LMP 12/25/2011   SpO2 96%   Salient findings:  CN II-XII intact Bilateral EAC clear and TM intact with well pneumatized middle ear spaces No lesions of oral cavity/oropharynx No respiratory distress or stridor  Seprately Identifiable Procedures:  Prior to initiating any procedures, risks/benefits/alternatives were explained to the patient and  verbal consent obtained. none   Impression & Plans:  Angela Snyder is a 68 y.o. female with   1. Tinnitus of both ears   2. Sensorineural hearing loss (SNHL) of both ears     Assessment and Plan Assessment & Plan   Tinnitus Chronic bilateral tinnitus primarily affecting sleep. Audiogram shows no alarming findings. She is interested in seeing a tinnitus specialist  - Referred to tinnitus specialist at [facility] for further evaluation and management options. - Advised to return if pain increases or other symptoms develop.  Stiffness of right ear drum  - As tympanogram on right but does not appear to be causing significant change in hearing compared to left. Ok to continue to monitor   Bilateral high frequency hearing loss Moderate high frequency hearing loss noted on audiogram, does not severely impact pt  Can consider hearing aids if interested    See below regarding exact medications prescribed this encounter including dosages and route: No orders of the defined types were placed in this encounter.   Thank you for allowing me the opportunity to care for your patient. Please do not hesitate to contact me should you have any other questions.  Sincerely, Hadassah Parody, MD Otolaryngologist (ENT), [facility] Phone: [phone] Fax: [phone]  MDM:  Level 4 Complexity/Problems addressed: chronic worsening problem Data complexity: mod- independent review of audiogram - Morbidity: low  - Prescription Drug prescribed or managed:
